# Patient Record
Sex: Male | Born: 1970 | Race: White | Hispanic: No | Marital: Single | Smoking: Never smoker
Health system: Southern US, Community
[De-identification: ages and names within clinical notes are randomized; demographics above are authoritative.]

## PROBLEM LIST (undated history)

## (undated) DIAGNOSIS — F639 Impulse disorder, unspecified: Secondary | ICD-10-CM

## (undated) DIAGNOSIS — G47419 Narcolepsy without cataplexy: Secondary | ICD-10-CM

## (undated) DIAGNOSIS — I10 Essential (primary) hypertension: Secondary | ICD-10-CM

## (undated) DIAGNOSIS — Q909 Down syndrome, unspecified: Secondary | ICD-10-CM

## (undated) DIAGNOSIS — G4730 Sleep apnea, unspecified: Secondary | ICD-10-CM

## (undated) DIAGNOSIS — F429 Obsessive-compulsive disorder, unspecified: Secondary | ICD-10-CM

## (undated) DIAGNOSIS — F72 Severe intellectual disabilities: Secondary | ICD-10-CM

## (undated) DIAGNOSIS — H5210 Myopia, unspecified eye: Secondary | ICD-10-CM

## (undated) DIAGNOSIS — H9190 Unspecified hearing loss, unspecified ear: Secondary | ICD-10-CM

## (undated) DIAGNOSIS — E119 Type 2 diabetes mellitus without complications: Secondary | ICD-10-CM

## (undated) DIAGNOSIS — H52209 Unspecified astigmatism, unspecified eye: Secondary | ICD-10-CM

## (undated) DIAGNOSIS — E079 Disorder of thyroid, unspecified: Secondary | ICD-10-CM

## (undated) DIAGNOSIS — Q819 Epidermolysis bullosa, unspecified: Secondary | ICD-10-CM

## (undated) DIAGNOSIS — E039 Hypothyroidism, unspecified: Secondary | ICD-10-CM

---

## 2001-09-22 ENCOUNTER — Ambulatory Visit (HOSPITAL_COMMUNITY): Admission: RE | Admit: 2001-09-22 | Discharge: 2001-09-22 | Payer: Self-pay

## 2006-01-28 ENCOUNTER — Ambulatory Visit (HOSPITAL_BASED_OUTPATIENT_CLINIC_OR_DEPARTMENT_OTHER): Admission: RE | Admit: 2006-01-28 | Discharge: 2006-01-28 | Payer: Self-pay | Admitting: Specialist

## 2006-02-07 ENCOUNTER — Ambulatory Visit: Payer: Self-pay | Admitting: Internal Medicine

## 2006-04-21 ENCOUNTER — Ambulatory Visit (HOSPITAL_BASED_OUTPATIENT_CLINIC_OR_DEPARTMENT_OTHER): Admission: RE | Admit: 2006-04-21 | Discharge: 2006-04-21 | Payer: Self-pay | Admitting: Specialist

## 2006-04-25 ENCOUNTER — Ambulatory Visit: Payer: Self-pay | Admitting: Internal Medicine

## 2007-10-31 ENCOUNTER — Emergency Department (HOSPITAL_BASED_OUTPATIENT_CLINIC_OR_DEPARTMENT_OTHER): Admission: EM | Admit: 2007-10-31 | Discharge: 2007-10-31 | Payer: Self-pay | Admitting: Emergency Medicine

## 2008-03-21 ENCOUNTER — Ambulatory Visit (HOSPITAL_BASED_OUTPATIENT_CLINIC_OR_DEPARTMENT_OTHER): Admission: RE | Admit: 2008-03-21 | Discharge: 2008-03-21 | Payer: Self-pay | Admitting: Specialist

## 2008-03-21 ENCOUNTER — Ambulatory Visit: Payer: Self-pay | Admitting: Diagnostic Radiology

## 2008-05-04 ENCOUNTER — Ambulatory Visit (HOSPITAL_BASED_OUTPATIENT_CLINIC_OR_DEPARTMENT_OTHER): Admission: RE | Admit: 2008-05-04 | Discharge: 2008-05-04 | Payer: Self-pay | Admitting: Specialist

## 2008-05-04 ENCOUNTER — Ambulatory Visit: Payer: Self-pay | Admitting: Diagnostic Radiology

## 2008-06-12 ENCOUNTER — Ambulatory Visit: Payer: Self-pay | Admitting: Pulmonary Disease

## 2008-06-12 DIAGNOSIS — G4733 Obstructive sleep apnea (adult) (pediatric): Secondary | ICD-10-CM

## 2008-06-12 DIAGNOSIS — Q909 Down syndrome, unspecified: Secondary | ICD-10-CM

## 2008-06-12 DIAGNOSIS — F518 Other sleep disorders not due to a substance or known physiological condition: Secondary | ICD-10-CM

## 2008-06-26 ENCOUNTER — Emergency Department (HOSPITAL_BASED_OUTPATIENT_CLINIC_OR_DEPARTMENT_OTHER): Admission: EM | Admit: 2008-06-26 | Discharge: 2008-06-26 | Payer: Self-pay | Admitting: Emergency Medicine

## 2009-06-24 ENCOUNTER — Emergency Department (HOSPITAL_BASED_OUTPATIENT_CLINIC_OR_DEPARTMENT_OTHER): Admission: EM | Admit: 2009-06-24 | Discharge: 2009-06-24 | Payer: Self-pay | Admitting: Emergency Medicine

## 2010-04-28 LAB — WOUND CULTURE

## 2010-06-27 NOTE — Procedures (Signed)
Frey, Justin              ACCOUNT NO.:  [id number]   MEDICAL RECORD NO.:  [id number]          PATIENT TYPE:  OUT   LOCATION:  SLEEP CENTER                 FACILITY:  [facility]   PHYSICIAN:  Clinton D. Maple Hudson, MD, FCCP, FACPDATE OF BIRTH:  1971/08/07   DATE OF STUDY:                            NOCTURNAL POLYSOMNOGRAM   INDICATION FOR STUDY:  Obstructive sleep apnea with hypersomnia.   EPWORTH SLEEPINESS SCORE:  8/24.  Weight is recorded as 117 pounds.  A  diagnostic NPSG on January 28, 2006 recorded an AHI of 16 per hour.  C-  PAP titration is requested.   MEDICATIONS:  Home medications are listed and reviewed.   SLEEP ARCHITECTURE:  Total sleep time 243 minutes, with sleep efficiency  of 53%.  Stage 1 was 11%, Stage 2 77%; Stages 3 and 4 absent.  REM 10%  of total sleep time.  Sleep latency 196 minutes.  REM latency 232  minutes.  Awake after sleep onset 25 minutes.  Arousal index 30.3;  indicating increased sleep fragmentation, especially during the latter  half of the night.   RESPIRATORY DATA:  C-PAP titration protocol.  C-PAP was titrated to 10  CWP, AHI 0 per hour.  A small Mirage Quattro full face mask was used,  with heated humidifier.   OXYGEN DATA:  Snoring was prevented by C-PAP and oxygen saturation held  at 96% on room air.   CARDIAC DATA:  Normal sinus rhythm.   MOVEMENT-PARASOMNIA:  Occasional limb jerk with little effect on sleep.   IMPRESSIONS-RECOMMENDATIONS:  1. Successful C-PAP titration to 10 CWP, AHI 0 per hour.  A small      Mirage Quattro full face mask was used, with a heated humidifier.  2. Baseline diagnostic NPSG on January 28, 2006 had recorded an AHI      of 16 per hour.      Clinton D. Maple Hudson, MD, [facility], FACP  Diplomate, Biomedical engineer of Sleep Medicine  Electronically Signed     CDY/MEDQ  D:  04/25/2006 14:58:08  T:  04/26/2006 10:12:27  Job:  [id number]

## 2010-06-27 NOTE — Procedures (Signed)
NAMETAMARI, BUSIC NO.:  [id number]   MEDICAL RECORD NO.:  [id number]          PATIENT TYPE:  OUT   LOCATION:  SLEEP CENTER                 FACILITY:  [facility]   PHYSICIAN:  Clinton D. Maple Hudson, MD, FCCP, FACPDATE OF BIRTH:  26-Sep-1971   DATE OF STUDY:  01/28/2006                            NOCTURNAL POLYSOMNOGRAM   INDICATION FOR STUDY:  Hypersomnia with sleep apnea.   EPWORTH SLEEPINESS SCORE:  8/24, BMI 27.5, weight 114 pounds.   MEDICATIONS:  Abilify, Levothroid, Lexapro, benztropine, Depakote.   A diagnostic NP ST protocol was requested.   SLEEP ARCHITECTURE:  Total sleep time 315 minutes with sleep efficiency  68%. Stage 1 was 5%, stage 2 86%, stages 3 and 4 8%, REM 1%  of total  sleep time. Sleep latency 122 minutes, REM latency 153 minutes, awake  after sleep onset 24 minutes. Arousal index 13. Medications were taken  before arrival at the sleep center.   RESPIRATORY DATA:  Apnea hypopnea index (AHI, RDI) 16 obstructive events  per hour indicating moderate obstructive sleep apnea/hypopnea syndrome  (normal range  0-5 per hour). There were 38 obstructive apnea's, one mixed apnea, and  45 hypopnea's. All asleep and therefore all events were recorded while  supine.   OXYGEN DATA:  Mild snoring with oxygen desaturation to a nadir of 85%.  Mean oxygen saturation through the study was 95% on room air.   CARDIAC DATA:  Normal sinus rhythm, heart rate 53-83 beats per minute.   MOVEMENT-PARASOMNIA:  Occasional limb jerk with little effect on sleep.   IMPRESSIONS-RECOMMENDATIONS:  1. Moderate obstructive sleep apnea/hypopnea syndrome, AHI 16 per hour      (normal range 0-5 per hour). All asleep and therefore all events      were while supine. REM AHI zero per hour.  2. Consider return for CPAP titration or evaluate for alternative      therapies as appropriate.     Clinton D. Maple Hudson, MD, [facility], FACP  Diplomate, Biomedical engineer of Sleep Medicine  Electronically Signed    CDY/MEDQ  D:  02/07/2006 14:39:13  T:  02/07/2006 22:31:40  Job:  [id number]

## 2012-04-09 ENCOUNTER — Emergency Department (HOSPITAL_COMMUNITY): Payer: Medicare Other

## 2012-04-09 ENCOUNTER — Encounter (HOSPITAL_COMMUNITY): Payer: Self-pay | Admitting: Emergency Medicine

## 2012-04-09 ENCOUNTER — Emergency Department (HOSPITAL_COMMUNITY)
Admission: EM | Admit: 2012-04-09 | Discharge: 2012-04-10 | Disposition: A | Discharge status: Other Acute Inpatient Hospital | Payer: Medicare Other | Attending: Emergency Medicine | Admitting: Emergency Medicine

## 2012-04-09 DIAGNOSIS — R109 Unspecified abdominal pain: Secondary | ICD-10-CM | POA: Insufficient documentation

## 2012-04-09 DIAGNOSIS — Z862 Personal history of diseases of the blood and blood-forming organs and certain disorders involving the immune mechanism: Secondary | ICD-10-CM | POA: Insufficient documentation

## 2012-04-09 DIAGNOSIS — E119 Type 2 diabetes mellitus without complications: Secondary | ICD-10-CM | POA: Insufficient documentation

## 2012-04-09 DIAGNOSIS — Z8659 Personal history of other mental and behavioral disorders: Secondary | ICD-10-CM | POA: Insufficient documentation

## 2012-04-09 DIAGNOSIS — Z8639 Personal history of other endocrine, nutritional and metabolic disease: Secondary | ICD-10-CM | POA: Insufficient documentation

## 2012-04-09 DIAGNOSIS — N390 Urinary tract infection, site not specified: Secondary | ICD-10-CM | POA: Insufficient documentation

## 2012-04-09 DIAGNOSIS — R319 Hematuria, unspecified: Secondary | ICD-10-CM | POA: Insufficient documentation

## 2012-04-09 DIAGNOSIS — Z8669 Personal history of other diseases of the nervous system and sense organs: Secondary | ICD-10-CM | POA: Insufficient documentation

## 2012-04-09 HISTORY — DX: Myopia, unspecified eye: H52.10

## 2012-04-09 HISTORY — DX: Severe intellectual disabilities: F72

## 2012-04-09 HISTORY — DX: Disorder of thyroid, unspecified: E07.9

## 2012-04-09 HISTORY — DX: Narcolepsy without cataplexy: G47.419

## 2012-04-09 HISTORY — DX: Unspecified hearing loss, unspecified ear: H91.90

## 2012-04-09 HISTORY — DX: Type 2 diabetes mellitus without complications: E11.9

## 2012-04-09 HISTORY — DX: Impulse disorder, unspecified: F63.9

## 2012-04-09 HISTORY — DX: Down syndrome, unspecified: Q90.9

## 2012-04-09 HISTORY — DX: Epidermolysis bullosa, unspecified: Q81.9

## 2012-04-09 HISTORY — DX: Obsessive-compulsive disorder, unspecified: F42.9

## 2012-04-09 LAB — URINE MICROSCOPIC-ADD ON

## 2012-04-09 LAB — URINALYSIS, ROUTINE W REFLEX MICROSCOPIC
Bilirubin Urine: NEGATIVE
Glucose, UA: NEGATIVE mg/dL
Ketones, ur: NEGATIVE mg/dL
Nitrite: NEGATIVE
Protein, ur: >300 mg/dL — AB
Specific Gravity, Urine: 1.022 (ref 1.005–1.030)
Urobilinogen, UA: 0.2 mg/dL (ref 0.0–1.0)
pH: 7 (ref 5.0–8.0)

## 2012-04-09 LAB — GLUCOSE, CAPILLARY: Glucose-Capillary: 140 mg/dL — ABNORMAL HIGH (ref 70–99)

## 2012-04-09 MED ORDER — CEPHALEXIN 500 MG PO CAPS: 500.0000 mg | ORAL_CAPSULE | Freq: Three times a day (TID) | ORAL | Status: DC

## 2012-04-09 MED ORDER — DEXTROSE 5 % IV SOLN: 1.0000 g | INTRAVENOUS | Status: DC

## 2012-04-09 NOTE — ED Notes (Signed)
[id number]<QM> Expected date:<BR> Expected time:<BR> Means of arrival:<BR> Comments:<BR> EMS/41 yo MR from group home-incontinent-blood in urine/abdominal pain

## 2012-04-09 NOTE — ED Notes (Signed)
Pt lives in a group home. Hx of Downs Syndrome. Pt began having frank blood in the urine and LUQ abdominal pain.

## 2012-04-09 NOTE — ED Provider Notes (Signed)
History     CSN: [id number]  Arrival date & time 04/09/12  2123   First MD Initiated Contact with Patient 04/09/12 2207      Chief Complaint  Patient presents with  . Hematuria  . Abdominal Pain    (Consider location/radiation/quality/duration/timing/severity/associated sxs/prior treatment) Patient is a 42 y.o. male presenting with hematuria and abdominal pain. The history is provided by the patient. The history is limited by the condition of the patient.  Hematuria Associated symptoms include abdominal pain.  Abdominal Pain Associated symptoms: hematuria   pt with hx Downs Syndrome, from group home where he was noted to have blood in urine and c/o left flank pain. Pain better now. Level 5 caveat - very poor/difficult historian due to MR. No fevers.     Past Medical History  Diagnosis Date  . Impulse control disorder   . MR (mental retardation), severe   . Down's syndrome   . Diabetes mellitus without complication   . Thyroid disease     hypothyroid  . OCD (obsessive compulsive disorder)   . Keratolysis bullosa hereditaria   . Narcolepsy   . Hearing loss   . Myopia     History reviewed. No pertinent past surgical history.  History reviewed. No pertinent family history.  History  Substance Use Topics  . Smoking status: Never Smoker   . Smokeless tobacco: Never Used  . Alcohol Use: No      Review of Systems  Unable to perform ROS: Other  Gastrointestinal: Positive for abdominal pain.  Genitourinary: Positive for hematuria.  level 5 caveat  -  MR, Downs Syndrome.   Allergies  Review of patient's allergies indicates no known allergies.  Home Medications  No current outpatient prescriptions on file.  BP 109/63  Pulse 74  Temp(Src) 97.4 F (36.3 C) (Oral)  Resp 20  SpO2 98%  Physical Exam  Nursing note and vitals reviewed. Constitutional: He appears well-developed and well-nourished. No distress.  HENT:  Mouth/Throat: Oropharynx is clear and moist.   Eyes: Conjunctivae are normal. No scleral icterus.  Neck: Neck supple. No tracheal deviation present.  Cardiovascular: Normal rate, regular rhythm, normal heart sounds and intact distal pulses.   Pulmonary/Chest: Effort normal and breath sounds normal. No accessory muscle usage. No respiratory distress.  Abdominal: Soft. Bowel sounds are normal. He exhibits no distension and no mass. There is no tenderness. There is no rebound and no guarding.  Genitourinary:  No cva tenderness. Normal ext genitalia. No scrotal or testicular swelling or tenderness  Musculoskeletal: Normal range of motion. He exhibits no edema and no tenderness.  Neurological: He is alert.  Skin: Skin is warm and dry. No rash noted.  Psychiatric: He has a normal mood and affect.    ED Course  Procedures (including critical care time)  Results for orders placed during the hospital encounter of 04/09/12  GLUCOSE, CAPILLARY      Result Value Range   Glucose-Capillary 140 (*) 70 - 99 mg/dL  URINALYSIS, ROUTINE W REFLEX MICROSCOPIC      Result Value Range   Color, Urine RED (*) YELLOW   APPearance CLOUDY (*) CLEAR   Specific Gravity, Urine 1.022  1.005 - 1.030   pH 7.0  5.0 - 8.0   Glucose, UA NEGATIVE  NEGATIVE mg/dL   Hgb urine dipstick LARGE (*) NEGATIVE   Bilirubin Urine NEGATIVE  NEGATIVE   Ketones, ur NEGATIVE  NEGATIVE mg/dL   Protein, ur >409 (*) NEGATIVE mg/dL   Urobilinogen, UA 0.2  0.0 - 1.0 mg/dL   Nitrite NEGATIVE  NEGATIVE   Leukocytes, UA SMALL (*) NEGATIVE  URINE MICROSCOPIC-ADD ON      Result Value Range   Squamous Epithelial / LPF RARE  RARE   WBC, UA 0-2  <3 WBC/hpf   RBC / HPF TOO NUMEROUS TO COUNT  <3 RBC/hpf   Bacteria, UA RARE  RARE   Urine-Other URINALYSIS PERFORMED ON SUPERNATANT     Ct Abdomen Pelvis Wo Contrast  04/09/2012  *RADIOLOGY REPORT*  Clinical Data: Lower anterior pelvic pain  CT ABDOMEN AND PELVIS WITHOUT CONTRAST  Technique:  Multidetector CT imaging of the abdomen and  pelvis was performed following the standard protocol without intravenous contrast.  Comparison: None.  Findings: Mild bibasilar opacities, favor atelectasis.  Heart size within normal limits.  Organ abnormality/lesion detection is limited in the absence of intravenous contrast. Within this limitation, severe hepatic steatosis.  Unremarkable biliary system, spleen, pancreas, adrenal glands, kidneys.  No hydronephrosis or hydroureter  No CT evidence for colitis.  Mild colonic diverticulosis.  Normal appendix.  Small bowel loops are normal course and caliber.  No free intraperitoneal air or fluid. No lymphadenopathy.  Normal caliber aorta and branch vessels.  Circumferential bladder wall thickening with irregular intraluminal contour.  Small amount of perivesicular fat stranding / fluid.  No bladder wall emphysema.  No acute osseous finding.  Sequelae of prior posterior right rib fractures.  IMPRESSION: Marked circumferential bladder wall thickening with mild perivesicular fat stranding / fluid. Most in keeping with a nonspecific cystitis.  Recommend urinalysis and cystoscopy correlation.  Severe hepatic steatosis.   Original Report Authenticated By: Jearld Lesch, M.D.        MDM  Labs. Ct.  Reviewed nursing notes and prior charts for additional history.   Ct reviewed, no ureteral stone, ?cystitis. Will cx urine. Rocephin iv.   rx keflex for home.   abd soft nt. No nv. Pt appears stable for d/c.         Suzi Roots, MD 04/09/12 2329

## 2012-04-10 MED ORDER — CEFTRIAXONE SODIUM 1 G IJ SOLR
1.0000 g | Freq: Once | INTRAMUSCULAR | Status: AC
  Administered 2012-04-10: 1 g via INTRAMUSCULAR
  Filled 2012-04-10: qty 10

## 2012-04-10 NOTE — ED Notes (Signed)
Report called to group home Rn, Rosey Bath.

## 2012-04-11 LAB — URINE CULTURE

## 2012-04-23 ENCOUNTER — Encounter (HOSPITAL_COMMUNITY): Payer: Self-pay | Admitting: Nurse Practitioner

## 2012-04-23 ENCOUNTER — Emergency Department (HOSPITAL_COMMUNITY)
Admission: EM | Admit: 2012-04-23 | Discharge: 2012-04-23 | Disposition: A | Discharge status: Home / Self Care | Payer: Medicare Other | Attending: Emergency Medicine | Admitting: Emergency Medicine

## 2012-04-23 DIAGNOSIS — Z8659 Personal history of other mental and behavioral disorders: Secondary | ICD-10-CM | POA: Insufficient documentation

## 2012-04-23 DIAGNOSIS — Z8669 Personal history of other diseases of the nervous system and sense organs: Secondary | ICD-10-CM | POA: Insufficient documentation

## 2012-04-23 DIAGNOSIS — N390 Urinary tract infection, site not specified: Secondary | ICD-10-CM | POA: Insufficient documentation

## 2012-04-23 DIAGNOSIS — R300 Dysuria: Secondary | ICD-10-CM

## 2012-04-23 DIAGNOSIS — R109 Unspecified abdominal pain: Secondary | ICD-10-CM | POA: Insufficient documentation

## 2012-04-23 DIAGNOSIS — H9190 Unspecified hearing loss, unspecified ear: Secondary | ICD-10-CM | POA: Insufficient documentation

## 2012-04-23 DIAGNOSIS — R350 Frequency of micturition: Secondary | ICD-10-CM | POA: Insufficient documentation

## 2012-04-23 DIAGNOSIS — F605 Obsessive-compulsive personality disorder: Secondary | ICD-10-CM | POA: Insufficient documentation

## 2012-04-23 DIAGNOSIS — Z7982 Long term (current) use of aspirin: Secondary | ICD-10-CM | POA: Insufficient documentation

## 2012-04-23 DIAGNOSIS — Z87798 Personal history of other (corrected) congenital malformations: Secondary | ICD-10-CM | POA: Insufficient documentation

## 2012-04-23 DIAGNOSIS — Z79899 Other long term (current) drug therapy: Secondary | ICD-10-CM | POA: Insufficient documentation

## 2012-04-23 DIAGNOSIS — E039 Hypothyroidism, unspecified: Secondary | ICD-10-CM | POA: Insufficient documentation

## 2012-04-23 DIAGNOSIS — E119 Type 2 diabetes mellitus without complications: Secondary | ICD-10-CM | POA: Insufficient documentation

## 2012-04-23 LAB — URINALYSIS, ROUTINE W REFLEX MICROSCOPIC
Bilirubin Urine: NEGATIVE
Glucose, UA: NEGATIVE mg/dL
Hgb urine dipstick: NEGATIVE
Specific Gravity, Urine: 1.014 (ref 1.005–1.030)
pH: 6.5 (ref 5.0–8.0)

## 2012-04-23 NOTE — ED Notes (Signed)
Urine sent

## 2012-04-23 NOTE — ED Notes (Signed)
Pt seen at [facility] for [facility] on 3/[facility], started on oral abx but symptoms persist. Staff that care for pt reports hematuria has resolved but continues to have urinary frequency and L side pain

## 2012-04-23 NOTE — ED Provider Notes (Signed)
History     CSN: [id number]  Arrival date & time 04/23/12  1322   First MD Initiated Contact with Patient 04/23/12 1421      Chief Complaint  Patient presents with  . Urinary Tract Infection    (Consider location/radiation/quality/duration/timing/severity/associated sxs/prior treatment) HPI Comments: Patient with a history of Down's syndrome presents for urinary frequency, left lower quadrant and flank pain x 2 weeks. Patient was seen on March 1 for chief complaint along with hematuria. Patient was worked up with labs, urinalysis, and CT abdomen pelvis without contrast; findings suggestive of cystitis. Patient treated in the ED with IV Rocephin and discharged with Keflex. Nurses state that patient completed his course of antibiotics and his hematuria has resolved, but his urinary frequency and complaints of left lower quadrant and flank pain remain the same. Group home denies fevers and changes in eating habits; state patient has had normal bowel movements, no nausea, and no emesis. Patient is a poor historian secondary to his mental retardation.  Patient is a 42 y.o. male presenting with urinary tract infection.  Urinary Tract Infection Associated symptoms include abdominal pain. Pertinent negatives include no fever.    Past Medical History  Diagnosis Date  . Impulse control disorder   . MR (mental retardation), severe   . Down's syndrome   . Diabetes mellitus without complication   . Thyroid disease     hypothyroid  . OCD (obsessive compulsive disorder)   . Keratolysis bullosa hereditaria   . Narcolepsy   . Hearing loss   . Myopia     History reviewed. No pertinent past surgical history.  History reviewed. No pertinent family history.  History  Substance Use Topics  . Smoking status: Never Smoker   . Smokeless tobacco: Never Used  . Alcohol Use: No    Review of Systems  Unable to perform ROS: Other  Constitutional: Negative for fever.  Gastrointestinal: Positive  for abdominal pain.  Genitourinary: Positive for frequency and flank pain. Negative for hematuria.    Allergies  Review of patient's allergies indicates no known allergies.  Home Medications   Current Outpatient Rx  Name  Route  Sig  Dispense  Refill  . aspirin 81 MG chewable tablet   Oral   Chew 81 mg by mouth daily.         . enalapril (VASOTEC) 2.5 MG tablet   Oral   Take 2.5 mg by mouth daily.         Marland Kitchen escitalopram (LEXAPRO) 10 MG tablet   Oral   Take 10 mg by mouth daily.         Marland Kitchen levothyroxine (SYNTHROID, LEVOTHROID) 75 MCG tablet   Oral   Take 75 mcg by mouth daily.         . metFORMIN (GLUCOPHAGE) 500 MG tablet   Oral   Take 500 mg by mouth 2 (two) times daily with a meal.         . oxybutynin (DITROPAN) 5 MG tablet   Oral   Take 10 mg by mouth 2 (two) times daily.         . propranolol ER (INDERAL LA) 160 MG SR capsule   Oral   Take 160 mg by mouth daily.         . risperiDONE (RISPERDAL) 0.5 MG tablet   Oral   Take 0.5 mg by mouth daily.         . temazepam (RESTORIL) 30 MG capsule   Oral   Take  30 mg by mouth at bedtime.         . topiramate (TOPAMAX) 100 MG tablet   Oral   Take 100 mg by mouth 3 (three) times daily.         . Vitamin D, Ergocalciferol, (DRISDOL) 50000 UNITS CAPS   Oral   Take 50,000 Units by mouth every 30 (thirty) days. Last day of every month           BP 141/105  Pulse 66  Temp(Src) 98.4 F (36.9 C) (Oral)  Resp 16  SpO2 96%  Physical Exam  Nursing note and vitals reviewed. Constitutional: He is oriented to person, place, and time. No distress.  Patient is resting comfortably in the bed in no acute distress.  HENT:  Right Ear: External ear normal.  Left Ear: External ear normal.  Mouth/Throat: Oropharynx is clear and moist. No oropharyngeal exudate.  Eyes: Conjunctivae are normal. Pupils are equal, round, and reactive to light. Right eye exhibits no discharge. Left eye exhibits no  discharge. No scleral icterus.  Neck: Normal range of motion. Neck supple.  Cardiovascular: Normal rate, regular rhythm and intact distal pulses.   Pulmonary/Chest: Effort normal and breath sounds normal. No respiratory distress. He has no wheezes. He has no rales.  Abdominal: Soft. Bowel sounds are normal. He exhibits distension. He exhibits no mass. There is no tenderness. There is no rebound and no guarding.  Genitourinary: Testes normal and penis normal. Right testis shows no mass and no swelling. Left testis shows no mass and no swelling. No penile erythema. No discharge found.  Musculoskeletal: Normal range of motion. He exhibits no edema.  Lymphadenopathy:    He has no cervical adenopathy.  Neurological: He is alert and oriented to person, place, and time.  Skin: Skin is warm and dry. No rash noted. He is not diaphoretic. No erythema.  Psychiatric: He has a normal mood and affect. His behavior is normal.    ED Course  Procedures (including critical care time)  Labs Reviewed  URINALYSIS, ROUTINE W REFLEX MICROSCOPIC   No results found.   1. Dysuria     MDM  Patient is a 42 year old male with mental retardation history who presents for complaints of left lower quadrant, left flank pain, and urinary frequency. Patient seen 2 weeks ago for same complaint as well as hematuria; was given kidney stone workup with findings consistent with cystitis. Patient treated in ED with IV Rocephin and as an outpatient with Keflex. Hematuria has resolved and urine from 04/09/2012 with no growth 48 hours after culture. Urinalysis ordered today as well as a bladder scan to assess pre-and post void residual. Patient otherwise well appearing with no findings elicited on physical exam; afebrile and VSS.  Patient's urinalysis normal without findings of urinary tract infection; nitrates and leukocytes negative. Patient signed out the Dr. Kathie Rhodes. Rancour at end of shift for dispo.     Antony Madura,  PA-C 04/23/12 2104

## 2012-04-24 NOTE — ED Provider Notes (Signed)
Medical screening examination/treatment/procedure(s) were conducted as a shared visit with non-physician practitioner(s) and myself.  I personally evaluated the patient during the encounter  Continued dysuria and flank pain after treatment for UTI 2 weeks ago. Urine culture negative.   Glynn Octave, MD 04/24/12 [id number]

## 2012-05-26 ENCOUNTER — Encounter (HOSPITAL_COMMUNITY): Payer: Self-pay | Admitting: Emergency Medicine

## 2012-05-26 ENCOUNTER — Emergency Department (HOSPITAL_COMMUNITY)
Admission: EM | Admit: 2012-05-26 | Discharge: 2012-05-26 | Disposition: A | Discharge status: Home / Self Care | Payer: Medicare Other | Attending: Emergency Medicine | Admitting: Emergency Medicine

## 2012-05-26 DIAGNOSIS — Z7982 Long term (current) use of aspirin: Secondary | ICD-10-CM | POA: Insufficient documentation

## 2012-05-26 DIAGNOSIS — Z8659 Personal history of other mental and behavioral disorders: Secondary | ICD-10-CM | POA: Insufficient documentation

## 2012-05-26 DIAGNOSIS — E039 Hypothyroidism, unspecified: Secondary | ICD-10-CM | POA: Insufficient documentation

## 2012-05-26 DIAGNOSIS — E119 Type 2 diabetes mellitus without complications: Secondary | ICD-10-CM | POA: Insufficient documentation

## 2012-05-26 DIAGNOSIS — N390 Urinary tract infection, site not specified: Secondary | ICD-10-CM | POA: Insufficient documentation

## 2012-05-26 DIAGNOSIS — Z79899 Other long term (current) drug therapy: Secondary | ICD-10-CM | POA: Insufficient documentation

## 2012-05-26 DIAGNOSIS — F429 Obsessive-compulsive disorder, unspecified: Secondary | ICD-10-CM | POA: Insufficient documentation

## 2012-05-26 DIAGNOSIS — Z8669 Personal history of other diseases of the nervous system and sense organs: Secondary | ICD-10-CM | POA: Insufficient documentation

## 2012-05-26 DIAGNOSIS — Q909 Down syndrome, unspecified: Secondary | ICD-10-CM | POA: Insufficient documentation

## 2012-05-26 DIAGNOSIS — H9190 Unspecified hearing loss, unspecified ear: Secondary | ICD-10-CM | POA: Insufficient documentation

## 2012-05-26 LAB — URINALYSIS, ROUTINE W REFLEX MICROSCOPIC
Glucose, UA: 100 mg/dL — AB
Specific Gravity, Urine: 1.023 (ref 1.005–1.030)
Urobilinogen, UA: 1 mg/dL (ref 0.0–1.0)
pH: 6.5 (ref 5.0–8.0)

## 2012-05-26 LAB — URINE MICROSCOPIC-ADD ON

## 2012-05-26 LAB — GLUCOSE, CAPILLARY: Glucose-Capillary: 163 mg/dL — ABNORMAL HIGH (ref 70–99)

## 2012-05-26 LAB — POCT I-STAT, CHEM 8
Glucose, Bld: 148 mg/dL — ABNORMAL HIGH (ref 70–99)
HCT: 44 % (ref 39.0–52.0)
Hemoglobin: 15 g/dL (ref 13.0–17.0)
Potassium: 4.1 mEq/L (ref 3.5–5.1)

## 2012-05-26 MED ORDER — CEPHALEXIN 500 MG PO CAPS: 500.0000 mg | ORAL_CAPSULE | Freq: Four times a day (QID) | ORAL | Status: DC

## 2012-05-26 MED ORDER — CEPHALEXIN 250 MG PO CAPS
500.0000 mg | ORAL_CAPSULE | Freq: Once | ORAL | Status: AC
  Administered 2012-05-26: 500 mg via ORAL
  Filled 2012-05-26: qty 1
  Filled 2012-05-26 (×2): qty 2

## 2012-05-26 NOTE — ED Provider Notes (Signed)
History     CSN: [id number]  Arrival date & time 05/26/12  1739   First MD Initiated Contact with Patient 05/26/12 1940      Chief Complaint  Patient presents with  . Hematuria    Patient is a 42 y.o. male presenting with hematuria. The history is provided by the patient and a caregiver. The history is limited by a developmental delay.  Hematuria This is a recurrent problem. Episode onset: earlier today. The problem occurs hourly. The problem has been gradually worsening. Pertinent negatives include no chest pain and no abdominal pain. Nothing aggravates the symptoms. Nothing relieves the symptoms.  pt lives in group home He presents with his nurse bethany who knows patient well He has had several episodes of hematuria today No fever/vomiting.  He has reported back pain No trauma reported There is no new weakness reported He is not on anticoagulants Per nursing he has had this previously.  He has urology followup next week  Past Medical History  Diagnosis Date  . Impulse control disorder   . MR (mental retardation), severe   . Down's syndrome   . Diabetes mellitus without complication   . Thyroid disease     hypothyroid  . OCD (obsessive compulsive disorder)   . Keratolysis bullosa hereditaria   . Narcolepsy   . Hearing loss   . Myopia     History reviewed. No pertinent past surgical history.  History reviewed. No pertinent family history.  History  Substance Use Topics  . Smoking status: Never Smoker   . Smokeless tobacco: Never Used  . Alcohol Use: No      Review of Systems  Unable to perform ROS: Patient nonverbal  Cardiovascular: Negative for chest pain.  Gastrointestinal: Negative for abdominal pain.  Genitourinary: Positive for hematuria.    Allergies  Review of patient's allergies indicates no known allergies.  Home Medications   Current Outpatient Rx  Name  Route  Sig  Dispense  Refill  . aspirin 81 MG chewable tablet   Oral   Chew 81 mg by  mouth daily.         . enalapril (VASOTEC) 2.5 MG tablet   Oral   Take 2.5 mg by mouth daily. Hold if BP <90/60         . escitalopram (LEXAPRO) 10 MG tablet   Oral   Take 10 mg by mouth daily.         Marland Kitchen levothyroxine (SYNTHROID, LEVOTHROID) 75 MCG tablet   Oral   Take 75 mcg by mouth daily.         . metFORMIN (GLUCOPHAGE) 500 MG tablet   Oral   Take 500 mg by mouth 2 (two) times daily with a meal.         . oxybutynin (DITROPAN) 5 MG tablet   Oral   Take 10 mg by mouth 2 (two) times daily.         . propranolol (INDERAL) 60 MG tablet   Oral   Take 60 mg by mouth at bedtime. Hold if pulse <60 or BP <90/60         . propranolol ER (INDERAL LA) 160 MG SR capsule   Oral   Take 160 mg by mouth daily.         . risperiDONE (RISPERDAL) 0.5 MG tablet   Oral   Take 0.5 mg by mouth daily.         . risperiDONE (RISPERDAL) 1 MG tablet   Oral  Take 1 mg by mouth at bedtime.         . temazepam (RESTORIL) 30 MG capsule   Oral   Take 30 mg by mouth at bedtime.         . topiramate (TOPAMAX) 100 MG tablet   Oral   Take 100 mg by mouth 3 (three) times daily.         . Vitamin D, Ergocalciferol, (DRISDOL) 50000 UNITS CAPS   Oral   Take 50,000 Units by mouth every 30 (thirty) days. Last day of every month           BP 104/73  Pulse 73  Temp(Src) 98.1 F (36.7 C) (Oral)  SpO2 98%  Physical Exam CONSTITUTIONAL: pt is in no distress.  He appears to have Downs syndrome HEAD: Normocephalic/atraumatic EYES: EOMI ENMT: Mucous membranes moist NECK: supple no meningeal signs CV: no murmurs/rubs/gallops noted LUNGS: Lungs are clear to auscultation bilaterally, no apparent distress ABDOMEN: soft, nontender, no rebound or guarding GU:no cva tenderness. No scrotal tenderness/erythema/bruising/swelling noted He is circumcised.  No blood noted at meatus. No suprapubic tenderness NEURO: Pt is awake/alert, moves all extremitiesx4, no distress is  noted EXTREMITIES: pulses normal, full ROM SKIN: warm, color normal   ED Course  Procedures  Labs Reviewed  URINALYSIS, ROUTINE W REFLEX MICROSCOPIC - Abnormal; Notable for the following:    Color, Urine RED (*)    APPearance TURBID (*)    Glucose, UA 100 (*)    Hgb urine dipstick LARGE (*)    Bilirubin Urine LARGE (*)    Ketones, ur 40 (*)    Protein, ur >300 (*)    Nitrite POSITIVE (*)    Leukocytes, UA MODERATE (*)    All other components within normal limits  URINE MICROSCOPIC-ADD ON - Abnormal; Notable for the following:    Bacteria, UA FEW (*)    All other components within normal limits  GLUCOSE, CAPILLARY - Abnormal; Notable for the following:    Glucose-Capillary 163 (*)    All other components within normal limits  URINE CULTURE   8:54 PM Pt with h/o MR and Downs syndrome here for hematuria He is in no distress but his history is limited Will perform bladder scan to ensure no significant urinary retention as may have some clots.  Will also check chem 8 panel to evaluate for anemia or renal dysfunction He has had CT imaging recently in our system do not feel this needs to be repeated Will start on antibiotics and send urine culture 9:36 PM No significant urinary retention Renal function appropriate and no anemia Stable for d/c.  He has followup with urology already arranged   MDM  Nursing notes including past medical history and social history reviewed and considered in documentation Labs/vital reviewed and considered Previous records reviewed and considered         Joya Gaskins, MD 05/26/12 2138

## 2012-05-26 NOTE — ED Notes (Signed)
Pt here with group home rep c/o hematuria today; pt sts pain when he goes to bathroom; pt with hx of MR and down syndrome

## 2012-05-26 NOTE — ED Notes (Signed)
The pt lives in a group home and is here with a caregiver.  Bright red blood with urination today and frequent urination.  History of the same.  Pt alert  Caregiver answering all questions asked

## 2012-05-26 NOTE — ED Notes (Addendum)
Bladder scan amount 120

## 2012-05-28 LAB — URINE CULTURE: Colony Count: NO GROWTH

## 2012-06-23 DIAGNOSIS — K7689 Other specified diseases of liver: Secondary | ICD-10-CM | POA: Insufficient documentation

## 2013-10-19 ENCOUNTER — Encounter (HOSPITAL_COMMUNITY): Payer: Self-pay | Admitting: Pharmacy Technician

## 2013-10-20 ENCOUNTER — Encounter (HOSPITAL_COMMUNITY)
Admission: RE | Admit: 2013-10-20 | Discharge: 2013-10-20 | Disposition: A | Discharge status: Home / Self Care | Payer: Medicare Other | Source: Ambulatory Visit | Attending: Anesthesiology | Admitting: Anesthesiology

## 2013-10-20 ENCOUNTER — Encounter (HOSPITAL_COMMUNITY): Payer: Self-pay

## 2013-10-20 ENCOUNTER — Encounter (HOSPITAL_COMMUNITY)
Admission: RE | Admit: 2013-10-20 | Discharge: 2013-10-20 | Disposition: A | Discharge status: Home / Self Care | Payer: Medicare Other | Source: Ambulatory Visit | Attending: Dentistry | Admitting: Dentistry

## 2013-10-20 DIAGNOSIS — K029 Dental caries, unspecified: Secondary | ICD-10-CM | POA: Insufficient documentation

## 2013-10-20 DIAGNOSIS — Z01812 Encounter for preprocedural laboratory examination: Secondary | ICD-10-CM | POA: Diagnosis present

## 2013-10-20 DIAGNOSIS — Z01810 Encounter for preprocedural cardiovascular examination: Secondary | ICD-10-CM | POA: Insufficient documentation

## 2013-10-20 HISTORY — DX: Sleep apnea, unspecified: G47.30

## 2013-10-20 HISTORY — DX: Hypothyroidism, unspecified: E03.9

## 2013-10-20 HISTORY — DX: Essential (primary) hypertension: I10

## 2013-10-20 LAB — CBC
HCT: 41.6 % (ref 39.0–52.0)
HEMOGLOBIN: 14.4 g/dL (ref 13.0–17.0)
MCH: 32.5 pg (ref 26.0–34.0)
MCHC: 34.6 g/dL (ref 30.0–36.0)
MCV: 93.9 fL (ref 78.0–100.0)
Platelets: 124 10*3/uL — ABNORMAL LOW (ref 150–400)
RBC: 4.43 MIL/uL (ref 4.22–5.81)
RDW: 13.9 % (ref 11.5–15.5)
WBC: 6.4 10*3/uL (ref 4.0–10.5)

## 2013-10-20 LAB — BASIC METABOLIC PANEL
Anion gap: 13 (ref 5–15)
BUN: 14 mg/dL (ref 6–23)
CHLORIDE: 100 meq/L (ref 96–112)
CO2: 23 mEq/L (ref 19–32)
CREATININE: 0.78 mg/dL (ref 0.50–1.35)
Calcium: 8.9 mg/dL (ref 8.4–10.5)
Glucose, Bld: 281 mg/dL — ABNORMAL HIGH (ref 70–99)
Potassium: 4.5 mEq/L (ref 3.7–5.3)
Sodium: 136 mEq/L — ABNORMAL LOW (ref 137–147)

## 2013-10-20 NOTE — Progress Notes (Addendum)
Primary - [facility] Does not have cardiologist No recent cardiac testing that caregivers can remember. Mother was not present at preadmission.  Caregivers stated they believe consent was signed at dr. Cathlean [place] office by mother

## 2013-10-20 NOTE — Pre-Procedure Instructions (Signed)
Justin Frey  10/20/2013   Your procedure is scheduled on:  Thursday, September 17th  Report to [facility] Admitting at 0830 AM.  Call this number if you have problems the morning of surgery: [phone]   Remember:   Do not eat food or drink liquids after midnight.   Take these medicines the morning of surgery with A SIP OF WATER: lexapro, synthroid, propranolol, risperdal, topamax   Do not wear jewelry.  Do not wear lotions, powders, or perfumes. You may wear deodorant.  Men may shave face and neck.  Do not bring valuables to the hospital.  [facility] is not responsible for any belongings or valuables.               Contacts, dentures or bridgework may not be worn into surgery.  Leave suitcase in the car. After surgery it may be brought to your room.  For patients admitted to the hospital, discharge time is determined by your  treatment team.               Patients discharged the day of surgery will not be allowed to drive home.  Please read over the following fact sheets that you were given: Pain Booklet, Coughing and Deep Breathing and Surgical Site Infection Prevention [place] - Preparing for Surgery  Before surgery, you can play an important role.  Because skin is not sterile, your skin needs to be as free of germs as possible.  You can reduce the number of germs on you skin by washing with CHG (chlorahexidine gluconate) soap before surgery.  CHG is an antiseptic cleaner which kills germs and bonds with the skin to continue killing germs even after washing.  Please DO NOT use if you have an allergy to CHG or antibacterial soaps.  If your skin becomes reddened/irritated stop using the CHG and inform your nurse when you arrive at Short Stay.  Do not shave (including legs and underarms) for at least 48 hours prior to the first CHG shower.  You may shave your face.  Please follow these instructions carefully:   1.  Shower with CHG Soap the night before surgery  and the morning of Surgery.  2.  If you choose to wash your hair, wash your hair first as usual with your normal shampoo.  3.  After you shampoo, rinse your hair and body thoroughly to remove the shampoo.  4.  Use CHG as you would any other liquid soap.  You can apply CHG directly to the skin and wash gently with scrungie or a clean washcloth.  5.  Apply the CHG Soap to your body ONLY FROM THE NECK DOWN.  Do not use on open wounds or open sores.  Avoid contact with your eyes, ears, mouth and genitals (private parts).  Wash genitals (private parts) with your normal soap.  6.  Wash thoroughly, paying special attention to the area where your surgery will be performed.  7.  Thoroughly rinse your body with warm water from the neck down.  8.  DO NOT shower/wash with your normal soap after using and rinsing off the CHG Soap.  9.  Pat yourself dry with a clean towel.            10.  Wear clean pajamas.            11.  Place clean sheets on your bed the night of your first shower and do not sleep with pets.  Day of  Surgery  Do not apply any lotions/deoderants the morning of surgery.  Please wear clean clothes to the hospital/surgery center.

## 2013-10-23 NOTE — Progress Notes (Addendum)
Anesthesia Chart Review:  Patient is a 43 year old male posted for dental restoration/extraction with xray and cleanings on 10/1713 by Dr. Martie Round.    History includes non-smoker, Down's Syndrome, impulse control disorder, severe mental retardation, OCD, narcolepsy, DM2 (reported fasting 150-200), HTN, hypothyroidism, myopia, OSA without CPAP use, hearing loss, keratolysis bullosa hereditaria, hepatic steatosis by 2014 CT.  BMI is consistent with morbid obesity.  PCP is Dr. Midge Aver.  Mom did not come with patient to his PAT visit.  I was not asked to evaluate him during his PAT appointment.   Meds: Citucel, ASA 81 mg, Calcium with D, Votaren, Vasotec, Lexapro, Synthroid, metformin, Relafen, Inderal LA, risperidone, Zocor, Restoril, Topamax.    EKG on 10/20/13 showed: NSR, minimal voltage criteria for LVH, possible anterior infarct (age undetermined). Currently, no comparison EKG is available.  CXR on 10/20/13 showed: No active cardiopulmonary disease.  Preoperative labs noted.  Glucose is 281.  He will get a fasting CBG on arrival.  PLT are low at 124K.  No HFP was done at PAT because no liver disease history reported, although he had severe hepatic steatosis on abdominal CT on 04/09/12.    I left a message with Boyd Kerbs at Dr. Janyce Llanos office regarding need for records, H&P. Will follow-up once available.    Velna Ochs [facility] Short Stay Center/Anesthesiology Phone [phone] 10/23/2013 2:59 PM  Addendum:  09/28/13 H&P from Regis Bill, FNP-C at [facility] received. I called and spoke with nurse Rosey Bath at [facility], who confirmed patient was without any known cardiac issues.  She was not aware of any prior c-spine films.  I finally heard back from patient's mother Dasean Brow [phone]) this afternoon.  She also confirms that her son does not have any known cardiac issues or congenital heart defects.  No on-going specialists.  She denies prior c-spine films or known cardiac testing  (other than his heart "checking out okay" at birth). She denies that he or their family has had any known prior anesthesia complications.  She did inquire whether or not he would need to stay overnight.  He has OSA without cpap use, so I told her that depending on how he did in recovery he may need to stay overnight due to his untreated OSA.  A final decision will be made on the day of his procedure.  She will try to be here on the day of surgery, but her husband has advanced Parkinson's disease and it may depend on how he is doing that day.    Prior to my conversation with patient's mother, I reviewed his chart with anesthesiologist Dr. Katrinka Blazing.  If no prior c-spine films, then he will need c-spine flexion-extension xray on the day of surgery.  He will also need HFP due to mild thrombocytopenia with history of hepatic steatosis.    Velna Ochs [facility] Short Stay Center/Anesthesiology Phone [phone] 10/24/2013 5:43 PM

## 2013-10-24 ENCOUNTER — Encounter (HOSPITAL_COMMUNITY): Payer: Self-pay

## 2013-10-25 ENCOUNTER — Other Ambulatory Visit: Payer: Self-pay | Admitting: Dentistry

## 2013-10-26 ENCOUNTER — Ambulatory Visit (HOSPITAL_COMMUNITY)
Admission: RE | Admit: 2013-10-26 | Discharge: 2013-10-26 | Disposition: A | Discharge status: Home / Self Care | Payer: Medicare Other | Source: Ambulatory Visit | Attending: Dentistry | Admitting: Dentistry

## 2013-10-26 ENCOUNTER — Encounter (HOSPITAL_COMMUNITY): Payer: Medicare Other | Admitting: Vascular Surgery

## 2013-10-26 ENCOUNTER — Encounter (HOSPITAL_COMMUNITY): Payer: Self-pay | Admitting: *Deleted

## 2013-10-26 ENCOUNTER — Encounter (HOSPITAL_COMMUNITY)
Admission: RE | Disposition: A | Discharge status: Home / Self Care | Payer: Self-pay | Source: Ambulatory Visit | Attending: Dentistry

## 2013-10-26 ENCOUNTER — Ambulatory Visit (HOSPITAL_COMMUNITY): Payer: Medicare Other | Admitting: Anesthesiology

## 2013-10-26 ENCOUNTER — Ambulatory Visit (HOSPITAL_COMMUNITY): Payer: Medicare Other

## 2013-10-26 DIAGNOSIS — F79 Unspecified intellectual disabilities: Secondary | ICD-10-CM | POA: Insufficient documentation

## 2013-10-26 DIAGNOSIS — K029 Dental caries, unspecified: Secondary | ICD-10-CM | POA: Insufficient documentation

## 2013-10-26 HISTORY — PX: DENTAL RESTORATION/EXTRACTION WITH X-RAY: SHX5796

## 2013-10-26 LAB — HEPATIC FUNCTION PANEL
ALT: 39 U/L (ref 0–53)
AST: 36 U/L (ref 0–37)
Albumin: 3.2 g/dL — ABNORMAL LOW (ref 3.5–5.2)
Alkaline Phosphatase: 59 U/L (ref 39–117)
BILIRUBIN TOTAL: 0.4 mg/dL (ref 0.3–1.2)
Bilirubin, Direct: <0.2 mg/dL (ref 0.0–0.3)
Total Protein: 7.2 g/dL (ref 6.0–8.3)

## 2013-10-26 LAB — GLUCOSE, CAPILLARY
GLUCOSE-CAPILLARY: 228 mg/dL — AB (ref 70–99)
GLUCOSE-CAPILLARY: 204 mg/dL — AB (ref 70–99)
Glucose-Capillary: 142 mg/dL — ABNORMAL HIGH (ref 70–99)

## 2013-10-26 SURGERY — DENTAL RESTORATION/EXTRACTION WITH X-RAY
Anesthesia: General | Site: Mouth

## 2013-10-26 MED ORDER — LACTATED RINGERS IV SOLN
INTRAVENOUS | Status: DC
  Administered 2013-10-26: 10:00:00 via INTRAVENOUS

## 2013-10-26 MED ORDER — FENTANYL CITRATE 0.05 MG/ML IJ SOLN
INTRAMUSCULAR | Status: DC | PRN
  Administered 2013-10-26: 50 ug via INTRAVENOUS

## 2013-10-26 MED ORDER — KETOROLAC TROMETHAMINE 30 MG/ML IJ SOLN
INTRAMUSCULAR | Status: DC | PRN
  Administered 2013-10-26: 30 mg via INTRAVENOUS

## 2013-10-26 MED ORDER — GLYCOPYRROLATE 0.2 MG/ML IJ SOLN
INTRAMUSCULAR | Status: DC | PRN
  Administered 2013-10-26: 0.4 mg via INTRAVENOUS

## 2013-10-26 MED ORDER — INSULIN ASPART 100 UNIT/ML ~~LOC~~ SOLN
SUBCUTANEOUS | Status: AC
  Filled 2013-10-26: qty 1

## 2013-10-26 MED ORDER — NEOSTIGMINE METHYLSULFATE 10 MG/10ML IV SOLN
INTRAVENOUS | Status: DC | PRN
  Administered 2013-10-26: 3 mg via INTRAVENOUS

## 2013-10-26 MED ORDER — PROPOFOL 10 MG/ML IV BOLUS
INTRAVENOUS | Status: AC
  Filled 2013-10-26: qty 20

## 2013-10-26 MED ORDER — ONDANSETRON HCL 4 MG/2ML IJ SOLN
INTRAMUSCULAR | Status: DC | PRN
  Administered 2013-10-26: 4 mg via INTRAVENOUS

## 2013-10-26 MED ORDER — KETOROLAC TROMETHAMINE 30 MG/ML IJ SOLN
INTRAMUSCULAR | Status: AC
Start: 2013-10-26 — End: 2013-10-26
  Filled 2013-10-26: qty 1

## 2013-10-26 MED ORDER — MIDAZOLAM HCL 2 MG/2ML IJ SOLN
INTRAMUSCULAR | Status: AC
  Filled 2013-10-26: qty 2

## 2013-10-26 MED ORDER — FENTANYL CITRATE 0.05 MG/ML IJ SOLN: 25.0000 ug | INTRAMUSCULAR | Status: DC | PRN

## 2013-10-26 MED ORDER — FENTANYL CITRATE 0.05 MG/ML IJ SOLN
INTRAMUSCULAR | Status: AC
  Filled 2013-10-26: qty 2

## 2013-10-26 MED ORDER — LIDOCAINE HCL (CARDIAC) 20 MG/ML IV SOLN
INTRAVENOUS | Status: DC | PRN
  Administered 2013-10-26: 100 mg via INTRAVENOUS

## 2013-10-26 MED ORDER — LACTATED RINGERS IV SOLN: INTRAVENOUS | Status: DC

## 2013-10-26 MED ORDER — PROPOFOL 10 MG/ML IV BOLUS
INTRAVENOUS | Status: DC | PRN
  Administered 2013-10-26: 120 mg via INTRAVENOUS

## 2013-10-26 MED ORDER — ROCURONIUM BROMIDE 100 MG/10ML IV SOLN
INTRAVENOUS | Status: DC | PRN
  Administered 2013-10-26: 30 mg via INTRAVENOUS

## 2013-10-26 MED ORDER — FENTANYL CITRATE 0.05 MG/ML IJ SOLN
INTRAMUSCULAR | Status: AC
  Filled 2013-10-26: qty 5

## 2013-10-26 MED ORDER — HEMOSTATIC AGENTS (NO CHARGE) OPTIME
TOPICAL | Status: DC | PRN
  Administered 2013-10-26: 1 via TOPICAL

## 2013-10-26 MED ORDER — PROMETHAZINE HCL 25 MG/ML IJ SOLN: 6.2500 mg | INTRAMUSCULAR | Status: DC | PRN

## 2013-10-26 MED ORDER — INSULIN ASPART 100 UNIT/ML ~~LOC~~ SOLN
10.0000 [IU] | Freq: Once | SUBCUTANEOUS | Status: AC
  Administered 2013-10-26: 10 [IU] via INTRAVENOUS

## 2013-10-26 MED ORDER — ONDANSETRON HCL 4 MG/2ML IJ SOLN
INTRAMUSCULAR | Status: AC
  Filled 2013-10-26: qty 2

## 2013-10-26 MED ORDER — EPHEDRINE SULFATE 50 MG/ML IJ SOLN
INTRAMUSCULAR | Status: DC | PRN
  Administered 2013-10-26 (×3): 5 mg via INTRAVENOUS

## 2013-10-26 MED ORDER — ROCURONIUM BROMIDE 50 MG/5ML IV SOLN
INTRAVENOUS | Status: AC
  Filled 2013-10-26: qty 1

## 2013-10-26 MED ORDER — LIDOCAINE-EPINEPHRINE 2 %-1:100000 IJ SOLN
INTRAMUSCULAR | Status: DC | PRN
  Administered 2013-10-26: 30 mL via INTRADERMAL

## 2013-10-26 MED ORDER — DEXMEDETOMIDINE HCL 200 MCG/2ML IV SOLN
INTRAVENOUS | Status: DC | PRN
  Administered 2013-10-26: 4 ug via INTRAVENOUS
  Administered 2013-10-26: 20 ug via INTRAVENOUS
  Administered 2013-10-26: 4 ug via INTRAVENOUS

## 2013-10-26 MED ORDER — MEPERIDINE HCL 25 MG/ML IJ SOLN: 6.2500 mg | INTRAMUSCULAR | Status: DC | PRN

## 2013-10-26 MED ORDER — GLYCOPYRROLATE 0.2 MG/ML IJ SOLN
INTRAMUSCULAR | Status: AC
  Filled 2013-10-26: qty 2

## 2013-10-26 MED ORDER — ARTIFICIAL TEARS OP OINT
TOPICAL_OINTMENT | OPHTHALMIC | Status: AC
  Filled 2013-10-26: qty 3.5

## 2013-10-26 SURGICAL SUPPLY — 23 items
BLADE 10 SAFETY STRL DISP (BLADE) ×3 IMPLANT
CANISTER SUCTION 2500CC (MISCELLANEOUS) ×3 IMPLANT
COVER PROBE W GEL 5X96 (DRAPES) ×3 IMPLANT
COVER SURGICAL LIGHT HANDLE (MISCELLANEOUS) ×3 IMPLANT
COVER TABLE BACK 60X90 (DRAPES) ×3 IMPLANT
DRAPE PROXIMA HALF (DRAPES) ×3 IMPLANT
GAUZE SPONGE 4X4 16PLY XRAY LF (GAUZE/BANDAGES/DRESSINGS) ×3 IMPLANT
GLOVE BIO SURGEON STRL SZ7.5 (GLOVE) ×5 IMPLANT
GOWN STRL REUS W/ TWL LRG LVL3 (GOWN DISPOSABLE) ×2 IMPLANT
GOWN STRL REUS W/TWL LRG LVL3 (GOWN DISPOSABLE) ×6
KIT BASIN OR (CUSTOM PROCEDURE TRAY) ×3 IMPLANT
KIT ROOM TURNOVER OR (KITS) ×3 IMPLANT
NEEDLE 27GAX1X1/2 (NEEDLE) ×2 IMPLANT
PAD ARMBOARD 7.5X6 YLW CONV (MISCELLANEOUS) ×6 IMPLANT
SPONGE SURGIFOAM ABS GEL SZ50 (HEMOSTASIS) ×3 IMPLANT
SUT CHROMIC 3 0 PS 2 (SUTURE) ×4 IMPLANT
SYR CONTROL 10ML LL (SYRINGE) ×2 IMPLANT
TOOTHBRUSH ADULT (PERSONAL CARE ITEMS) ×3 IMPLANT
TOWEL OR 17X26 10 PK STRL BLUE (TOWEL DISPOSABLE) ×3 IMPLANT
TUBE CONNECTING 12'X1/4 (SUCTIONS) ×1
TUBE CONNECTING 12X1/4 (SUCTIONS) ×2 IMPLANT
WATER STERILE IRR 1000ML POUR (IV SOLUTION) ×3 IMPLANT
YANKAUER SUCT BULB TIP NO VENT (SUCTIONS) ×3 IMPLANT

## 2013-10-26 NOTE — H&P (Signed)
H&P reviewed, no changes in assessment  

## 2013-10-26 NOTE — Anesthesia Postprocedure Evaluation (Signed)
  Anesthesia Post-op Note  Patient: Justin Frey  Procedure(s) Performed: Procedure(s): RECALL EXAM, FULL MOUTH X-RAYS, DEBRIDEMENT, EXTRACTION  OF # 4,H, 26 AND COMPOSITE #3,5 AND 19. (N/A)  Patient Location: PACU  Anesthesia Type:General  Level of Consciousness: awake and alert   Airway and Oxygen Therapy: Patient Spontanous Breathing and Patient connected to nasal cannula oxygen  Post-op Pain: none  Post-op Assessment: Post-op Vital signs reviewed, Patient's Cardiovascular Status Stable, Respiratory Function Stable, Patent Airway and No signs of Nausea or vomiting  Post-op Vital Signs: Reviewed and stable  Last Vitals:  Filed Vitals:   10/26/13 1215  BP:   Pulse: 61  Temp:   Resp: 12    Complications: No apparent anesthesia complications

## 2013-10-26 NOTE — Progress Notes (Signed)
CBG 328 Dr Berneice Heinrich called and informed with new orders noted.

## 2013-10-26 NOTE — Op Note (Signed)
NAMEMACKIE, HOLNESS NO.:  [id number]  MEDICAL RECORD NO.:  [id number]  LOCATION:  [facility]                         FACILITY:  [facility]  PHYSICIAN:  Esaw Dace., D.D.S.DATE OF BIRTH:  [birth year]  DATE OF PROCEDURE:  10/26/2013 DATE OF DISCHARGE:  10/26/2013                              OPERATIVE REPORT   PREOPERATIVE DIAGNOSIS:  Dental caries/behavior management issues due to intellectual/developmental disabilities.  Dental care provided in the OR for medically necessary treatment.  OPERATION:  Full mouth oral rehabilitation including exam, x-rays, cleaning, extractions.  OPERATIVE SURGEON:  Azucena Freed, D.D.S.  ASSISTANT:  Laqueta Carina and hospital staff.  ANESTHESIA:  General.  PROCEDURE IN DETAIL:  The patient was brought into the operating room and placed in supine position.  General anesthesia was administered via nasal intubation.  The patient was prepped and draped in usual manner for an intraoral general dentistry procedure.  Oropharynx was suctioned and moistened, posterior throat pack was placed.  A full intraoral exam including all hard and soft tissues was performed.  This was a recall exam.  The soft tissue exam reveals the floor of the mouth, buccal mucosa, soft palate, hard palate, tongue, gingival, and frenum attachments all within normal limits with regard to size, color, and consistency.  Hard tissue exam reveals tooth #1 missing, tooth #2 through #5 present, #6 uninterrupted, #7 through #10 present, H present, #11 uninterrupted, #12 through #15 present, #16 and #17 missing, tooth #18 through #32 present.  A full mouth series of digital x-rays was taken and full mouth debridement was completed.  Operative care was provided with a high-speed handpiece #4 bur.  Composites were completed on tooth #3, #5, and #19.  Extractions were completed on #4, H, and #26. Gelfoam was inserted in the extraction sites and 3-0 chromic  suture was used to close the size 3 mL of 2% lidocaine with 1:100,000 epinephrine was given.  Estimated blood loss of 10 mL.  The mouth was suctioned dry and a posterior throat pack was carefully removed with constant suction. The patient was awakened in the OR and transferred to the recovery room in good condition.  An extraction sheet was filled out by the patient's representative.     Esaw Dace., D.D.S.     WEM/MEDQ  D:  10/26/2013  T:  10/26/2013  Job:  [id number]

## 2013-10-26 NOTE — Discharge Instructions (Signed)
Discharge to home when stable. 

## 2013-10-26 NOTE — Transfer of Care (Signed)
Immediate Anesthesia Transfer of Care Note  Patient: Justin Frey  Procedure(s) Performed: Procedure(s): RECALL EXAM, FULL MOUTH X-RAYS, DEBRIDEMENT, EXTRACTION  OF # 4,H, 26 AND COMPOSITE #3,5 AND 19. (N/A)  Patient Location: PACU  Anesthesia Type:General  Level of Consciousness: awake and alert   Airway & Oxygen Therapy: Patient Spontanous Breathing  Post-op Assessment: Report given to PACU RN and Post -op Vital signs reviewed and stable  Post vital signs: Reviewed and stable  Complications: No apparent anesthesia complications

## 2013-10-26 NOTE — Op Note (Signed)
Recall Exam, FMX, FMD, Extractions #4,H,26,  Composites #3,5,19

## 2013-10-26 NOTE — Anesthesia Preprocedure Evaluation (Signed)
Anesthesia Evaluation  Patient identified by MRN, date of birth, ID band Patient awake    Reviewed: Allergy & Precautions, H&P , NPO status , Patient's Chart, lab work & pertinent test results  Airway       Dental   Pulmonary  OSA not on CPAP         Cardiovascular hypertension, Pt. on medications  NO heart HX   Neuro/Psych PSYCHIATRIC DISORDERS    GI/Hepatic   Endo/Other  diabetes, Poorly Controlled, Type 2  Renal/GU      Musculoskeletal   Abdominal   Peds  Hematology   Anesthesia Other Findings   Reproductive/Obstetrics                           Anesthesia Physical Anesthesia Plan  ASA: III  Anesthesia Plan: General   Post-op Pain Management:    Induction: Intravenous  Airway Management Planned: Oral ETT and Nasal ETT  Additional Equipment:   Intra-op Plan:   Post-operative Plan:   Informed Consent: I have reviewed the patients History and Physical, chart, labs and discussed the procedure including the risks, benefits and alternatives for the proposed anesthesia with the patient or authorized representative who has indicated his/her understanding and acceptance.     Plan Discussed with:   Anesthesia Plan Comments: (Morbidly obese, will do multinodal pain RX to lessen narcotics, Need to verify C-Spine films to be done today and Liver panel and sugar this am.  IF LFT OK will use Tylenol, also ketamine, precedex, decadron, no toradol platlets 124)        Anesthesia Quick Evaluation

## 2013-10-26 NOTE — Anesthesia Procedure Notes (Signed)
Procedure Name: Intubation Date/Time: 10/26/2013 10:20 AM Performed by: Margaree Mackintosh Pre-anesthesia Checklist: Patient identified, Emergency Drugs available, Suction available, Patient being monitored and Timeout performed Patient Re-evaluated:Patient Re-evaluated prior to inductionOxygen Delivery Method: Circle system utilized Preoxygenation: Pre-oxygenation with 100% oxygen Intubation Type: Combination inhalational/ intravenous induction Ventilation: Mask ventilation without difficulty and Oral airway inserted - appropriate to patient size Laryngoscope size: Small adult glidescope. Grade View: Grade I Tube type: Oral Tube size: 6.5 mm Number of attempts: 1 Airway Equipment and Method: Stylet and Video-laryngoscopy Placement Confirmation: ETT inserted through vocal cords under direct vision,  positive ETCO2 and breath sounds checked- equal and bilateral Secured at: 20 cm Tube secured with: Tape Dental Injury: Teeth and Oropharynx as per pre-operative assessment

## 2013-10-26 NOTE — Brief Op Note (Signed)
10/26/2013  12:05 PM  PATIENT:  Justin Frey  43 y.o. male  PRE-OPERATIVE DIAGNOSIS:  dental caries  POST-OPERATIVE DIAGNOSIS:  dental caries  PROCEDURE:  Procedure(s): RECALL EXAM, FULL MOUTH X-RAYS, DEBRIDEMENT, EXTRACTION  OF # 4,H, 26 AND COMPOSITE #3,5 AND 19. (N/A)  SURGEON:  Surgeon(s) and Role:    * Esaw Dace., DDS - Primary  PHYSICIAN ASSISTANT:   ASSISTANTS: Laqueta Carina   ANESTHESIA:   general  EBL:     BLOOD ADMINISTERED:none  DRAINS: none   LOCAL MEDICATIONS USED:  XYLOCAINE   SPECIMEN:  No Specimen  DISPOSITION OF SPECIMEN:  N/A  COUNTS:  YES  TOURNIQUET:  * No tourniquets in log *  DICTATION: .Note written in EPIC  PLAN OF CARE: Discharge to home after PACU  PATIENT DISPOSITION:  PACU - hemodynamically stable.   Delay start of Pharmacological VTE agent (>24hrs) due to surgical blood loss or risk of bleeding: not applicable

## 2013-10-30 ENCOUNTER — Encounter (HOSPITAL_COMMUNITY): Payer: Self-pay | Admitting: Dentistry

## 2017-04-13 ENCOUNTER — Telehealth: Payer: Self-pay | Admitting: Hematology

## 2017-04-13 NOTE — Telephone Encounter (Signed)
Appt has been scheduled for the pt to see Dr. Candise CheKale on 3/13 at 11am. [facility]. Aware that the pt should arrive 30 minutes early for check-in.

## 2017-04-19 NOTE — Progress Notes (Signed)
HEMATOLOGY/ONCOLOGY CONSULTATION NOTE  Date of Service: 04/21/2017  Patient Care Team: Gilda Crease, MD as PCP - General (Specialist)  [facility]: [phone] PCP Dr. Katina Dung: [phone]  CHIEF COMPLAINTS/PURPOSE OF CONSULTATION:  Pancytopenia  HISTORY OF PRESENTING ILLNESS:  Justin Frey is a wonderful 47 y.o. male who has been referred to [place] by Dr. Nicolasa Ducking from [facility] for evaluation and management of pancytopenia.   The pt has down syndrome and is accompanied by a Financial controller from [employer]. The [facility] employee reports that [name] guardian is his brother, and helps makes medical decisions on his behalf. The [facility] employee notes that his mother previously made medical decision on [name] behalf until her death about 2 years ago. The accompanying [facility] worker is unable to make any decisions on the patients behalf.  Patient is unable to provide any clinical information and does not have decisional capacity.   The [facility] employee notes that [name] has had no concerns for bleeding, and she notes that he has been eating well in general.   Most recent lab results (03/24/17) of CBC  is as follows: all values are WNL except for WBC at 2.3k, RBC at 4.05, Platelets at 57k, Neutrophils at 1.2k.  A 10/20/13 CBC reveals Platelets at 124k, but all other values were WNL  The pt is noted to take 100mg  Topamax.   On review of systems, pt reports some abdominal pain.   On PMHx the pt is noted to have Down's Syndrome and other medical co-morbidities as noted above.  MEDICAL HISTORY:  Past Medical History:  Diagnosis Date  . Diabetes mellitus without complication (HCC)    fasting 150-200  . Down's syndrome   . Hearing loss   . Hypertension   . Hypothyroidism   . Impulse control disorder   . Keratolysis bullosa hereditaria    his mother denied knowledge of this  . MR (mental retardation), severe   . Myopia   . Narcolepsy   .  OCD (obsessive compulsive disorder)   . Sleep apnea    will not wear cpap  . Thyroid disease    hypothyroid    SURGICAL HISTORY: Past Surgical History:  Procedure Laterality Date  . DENTAL RESTORATION/EXTRACTION WITH X-RAY N/A 10/26/2013   Procedure: RECALL EXAM, FULL MOUTH X-RAYS, DEBRIDEMENT, EXTRACTION  OF # 4,H, 26 AND COMPOSITE #3,5 AND 19.;  Surgeon: Esaw Dace., DDS;  Location: [facility] OR;  Service: Oral Surgery;  Laterality: N/A;    SOCIAL HISTORY: Social History   Socioeconomic History  . Marital status: Single    Spouse name: Not on file  . Number of children: Not on file  . Years of education: Not on file  . Highest education level: Not on file  Social Needs  . Financial resource strain: Not on file  . Food insecurity - worry: Not on file  . Food insecurity - inability: Not on file  . Transportation needs - medical: Not on file  . Transportation needs - non-medical: Not on file  Occupational History  . Not on file  Tobacco Use  . Smoking status: Never Smoker  . Smokeless tobacco: Never Used  Substance and Sexual Activity  . Alcohol use: No  . Drug use: No  . Sexual activity: No  Other Topics Concern  . Not on file  Social History Narrative  . Not on file    FAMILY HISTORY: History reviewed. No pertinent family history.  ALLERGIES:  has No Known Allergies.  MEDICATIONS:  Current Outpatient Medications  Medication Sig Dispense Refill  . amLODipine (NORVASC) 2.5 MG tablet Take 2.5 mg by mouth daily.    Marland Kitchen. aspirin 81 MG chewable tablet Chew 81 mg by mouth daily.    . enalapril (VASOTEC) 2.5 MG tablet Take 2.5 mg by mouth daily. Hold if BP <90/60    . risperiDONE (RISPERDAL) 1 MG tablet Take 1 mg by mouth 2 (two) times daily.     Marland Kitchen. topiramate (TOPAMAX) 100 MG tablet Take 100 mg by mouth 3 (three) times daily.    . calcium-vitamin D (OYSTER CALCIUM 500 + D) 500-200 MG-UNIT per tablet Take 1 tablet by mouth 2 (two) times daily.    . diclofenac sodium  (VOLTAREN) 1 % GEL Apply 2 g topically 2 (two) times daily.    Marland Kitchen. escitalopram (LEXAPRO) 20 MG tablet Take 20 mg by mouth every morning.    Marland Kitchen. glucose blood test strip 1 each by Other route See admin instructions. Check blood sugar twice daily.    Marland Kitchen. levothyroxine (SYNTHROID, LEVOTHROID) 88 MCG tablet Take 88 mcg by mouth daily before breakfast.    . metFORMIN (GLUCOPHAGE) 500 MG tablet Take 500 mg by mouth 2 (two) times daily with a meal.    . Methylcellulose, Laxative, (CITRUCEL) 500 MG TABS Take 1,000 mg by mouth daily.    . nabumetone (RELAFEN) 750 MG tablet Take 750 mg by mouth daily.    . propranolol ER (INDERAL LA) 160 MG SR capsule Take 160 mg by mouth daily.    . propranolol ER (INDERAL LA) 60 MG 24 hr capsule Take 60 mg by mouth at bedtime.    . simvastatin (ZOCOR) 20 MG tablet Take 20 mg by mouth daily.    . temazepam (RESTORIL) 30 MG capsule Take 30 mg by mouth at bedtime.     No current facility-administered medications for this visit.     REVIEW OF SYSTEMS:    10 Point review of Systems was done is negative except as noted above.  PHYSICAL EXAMINATION: ECOG PERFORMANCE STATUS: 2 - Symptomatic, <50% confined to bed  . Vitals:   04/21/17 1141  BP: 99/62  Pulse: 75  Resp: 20  Temp: 98 F (36.7 C)  SpO2: 98%   Filed Weights   04/21/17 1141  Weight: 124 lb 6.4 oz (56.4 kg)   .Body mass index is 34.64 kg/m.  GENERAL:alert, in no acute distress and comfortable SKIN: no acute rashes, no significant lesions EYES: conjunctiva are pink and non-injected, sclera anicteric OROPHARYNX: MMM, no exudates, no oropharyngeal erythema or ulceration NECK: supple, no JVD LYMPH:  no palpable lymphadenopathy in the cervical, axillary or inguinal regions LUNGS: clear to auscultation b/l with normal respiratory effort HEART: regular rate & rhythm ABDOMEN:  normoactive bowel sounds , non tender, not distended. Extremity: no pedal edema PSYCH: alert & oriented x 3 with fluent  speech NEURO: no focal motor/sensory deficits  LABORATORY DATA:  I have reviewed the data as listed  . CBC Latest Ref Rng & Units 10/20/2013 05/26/2012  WBC 4.0 - 10.5 K/uL 6.4 -  Hemoglobin 13.0 - 17.0 g/dL 91.414.4 78.215.0  Hematocrit 95.639.0 - 52.0 % 41.6 44.0  Platelets 150 - 400 K/uL 124(L) -    . CMP Latest Ref Rng & Units 10/26/2013 10/20/2013 05/26/2012  Glucose 70 - 99 mg/dL - 213(Y281(H) 865(H148(H)  BUN 6 - 23 mg/dL - 14 17  Creatinine 8.460.50 - 1.35 mg/dL - 9.620.78 9.520.80  Sodium 137 - 147 mEq/L - 136(L) 139  Potassium 3.7 - 5.3 mEq/L - 4.5 4.1  Chloride 96 - 112 mEq/L - 100 105  CO2 19 - 32 mEq/L - 23 -  Calcium 8.4 - 10.5 mg/dL - 8.9 -  Total Protein 6.0 - 8.3 g/dL 7.2 - -  Total Bilirubin 0.3 - 1.2 mg/dL 0.4 - -  Alkaline Phos 39 - 117 U/L 59 - -  AST 0 - 37 U/L 36 - -  ALT 0 - 53 U/L 39 - -   03/24/17 CBC Labs:    RADIOGRAPHIC STUDIES: I have personally reviewed the radiological images as listed and agreed with the findings in the report. No results found.  ASSESSMENT & PLAN:  47 y.o. male with  1. Pancytopenia ? The differential diagnosis if very broad at this time Medications - Risperdone, topamax vs primary BM disorder associated with his Down Syndrome's (MDS /AML) -Review of Mr. Krisher labs shows current pancytopenia, but previous labs were not made available at time of visit to put the current lab finding in proper context.   -We reached out to [facility] services today and let them know that we need to speak to the patient's PCP and power of attorney. -We let [facility] services know that at Mr. Eller next visit, his POA will need to be present as Mr. Montefusco does not have decisional capacity and there would be several elements of goals of care that will need to be defined to determine extent of workup. -No labs can be taken today without speaking with POA.  -Greater lab history and context of care will be needed and we would like to see pt return to care and be accompanied by his  POA. PLAN -would recommend patient PCP discuss with patient POA and determine his goals of care in the context of his significantly compromised mental status/Downs syndrome and psychiatrist issues. -if his goals of care allow for extensive lab workup and Bone marrow examination he will need these to further determine the etiology of his pancytopenia. -he will also need CT chest/abd/pelvis to evaluate for LNadenopathy/liver disease and splenomegaly. -we will see him back if his PCP determines this workup and related management is in keeping with patients goals of care as determined by PCP in tandem with POA.   All of the patients questions were answered with apparent satisfaction. The patient knows to call the clinic with any problems, questions or concerns.  I spent 20 minutes counseling the patient face to face. The total time spent in the appointment was 25 minutes and more than 50% was on counseling and direct patient cares.    Wyvonnia Lora MD MS AAHIVMS [facility] Hematology/Oncology Physician [facility]  (Office):       [phone] (Work cell):  [phone] (Fax):           [phone]  04/21/2017 11:56 AM  This document serves as a record of services personally performed by Wyvonnia Lora, MD. It was created on his behalf by Marcelline Mates, a trained medical scribe. The creation of this record is based on the scribe's personal observations and the provider's statements to them.   .I have reviewed the above documentation for accuracy and completeness, and I agree with the above. Johney Maine MD MS

## 2017-04-21 ENCOUNTER — Encounter: Payer: Self-pay | Admitting: Hematology

## 2017-04-21 ENCOUNTER — Inpatient Hospital Stay: Payer: Medicare Other | Attending: Hematology | Admitting: Hematology

## 2017-04-21 VITALS — BP 99/62 | HR 75 | Temp 98.0°F | Resp 20 | Ht <= 58 in | Wt 124.4 lb

## 2017-04-21 DIAGNOSIS — D61818 Other pancytopenia: Secondary | ICD-10-CM | POA: Diagnosis present

## 2017-04-21 DIAGNOSIS — E039 Hypothyroidism, unspecified: Secondary | ICD-10-CM | POA: Diagnosis not present

## 2017-04-21 DIAGNOSIS — Z79899 Other long term (current) drug therapy: Secondary | ICD-10-CM | POA: Diagnosis not present

## 2017-04-21 DIAGNOSIS — Q909 Down syndrome, unspecified: Secondary | ICD-10-CM | POA: Insufficient documentation

## 2017-04-22 ENCOUNTER — Telehealth: Payer: Self-pay

## 2017-04-22 NOTE — Telephone Encounter (Signed)
Mailed calender and letter to patient. Per 3/13 los

## 2017-04-22 NOTE — Telephone Encounter (Signed)
Attempt to get in touch with Tabitha at [facility] [phone] in order to get number for pt's legal guardian or HCPOA in order to make sure this individual is present at next appt. Currently, f/u scheduled for 3/28 at 1300, but attempting to confirm with HCPOA. No answer and no VM upon attempt to call.

## 2017-04-22 NOTE — Telephone Encounter (Signed)
Called the home to give new appointment date, and was direct to call the agency unable to get through or leave a message. Added contact information for [facility]  To patient chart. [facility] Per 3/13 los

## 2017-05-06 ENCOUNTER — Ambulatory Visit: Payer: Self-pay | Admitting: Hematology

## 2017-05-07 ENCOUNTER — Telehealth: Payer: Self-pay

## 2017-05-07 NOTE — Telephone Encounter (Signed)
Pt rescheduled with Dr. Gweneth DimitriPerlov on 05/11/17. Spoke with Dr. Gweneth DimitriPerlov and desk RN regarding need to get in touch with [facility] and family contact number/name to make sure that pt's legal guardian is present to help make decisions. Dr. Gweneth DimitriPerlov aware of pt add on. Number for [facility] given to Belenda CruiseKristin, RN to attempt contact.

## 2017-05-11 ENCOUNTER — Inpatient Hospital Stay: Payer: Medicare Other | Attending: Hematology | Admitting: Hematology and Oncology

## 2017-05-11 ENCOUNTER — Inpatient Hospital Stay: Payer: Medicare Other

## 2017-05-11 VITALS — BP 92/62 | HR 73 | Temp 97.5°F | Resp 20 | Ht <= 58 in | Wt 125.2 lb

## 2017-05-11 DIAGNOSIS — D61818 Other pancytopenia: Secondary | ICD-10-CM | POA: Diagnosis not present

## 2017-05-11 DIAGNOSIS — E039 Hypothyroidism, unspecified: Secondary | ICD-10-CM

## 2017-05-11 DIAGNOSIS — E119 Type 2 diabetes mellitus without complications: Secondary | ICD-10-CM

## 2017-05-11 LAB — CMP (CANCER CENTER ONLY)
ALBUMIN: 3.9 g/dL (ref 3.5–5.0)
ALK PHOS: 58 U/L (ref 40–150)
ALT: 13 U/L (ref 0–55)
AST: 16 U/L (ref 5–34)
Anion gap: 6 (ref 3–11)
BILIRUBIN TOTAL: 0.5 mg/dL (ref 0.2–1.2)
BUN: 15 mg/dL (ref 7–26)
CO2: 27 mmol/L (ref 22–29)
Calcium: 9.8 mg/dL (ref 8.4–10.4)
Chloride: 109 mmol/L (ref 98–109)
Creatinine: 1.14 mg/dL (ref 0.70–1.30)
GFR, Est AFR Am: >60 mL/min (ref >60)
GFR, Estimated: >60 mL/min (ref >60)
GLUCOSE: 150 mg/dL — AB (ref 70–140)
POTASSIUM: 5.1 mmol/L (ref 3.5–5.1)
Sodium: 142 mmol/L (ref 136–145)
TOTAL PROTEIN: 7.7 g/dL (ref 6.4–8.3)

## 2017-05-11 LAB — CBC WITH DIFFERENTIAL (CANCER CENTER ONLY)
BASOS ABS: 0 10*3/uL (ref 0.0–0.1)
Basophils Relative: 1 %
EOS ABS: 0 10*3/uL (ref 0.0–0.5)
Eosinophils Relative: 1 %
HCT: 40 % (ref 38.4–49.9)
Hemoglobin: 13.2 g/dL (ref 13.0–17.1)
LYMPHS PCT: 31 %
Lymphs Abs: 1.6 10*3/uL (ref 0.9–3.3)
MCH: 31.4 pg (ref 27.2–33.4)
MCHC: 33 g/dL (ref 32.0–36.0)
MCV: 95.2 fL (ref 79.3–98.0)
MONO ABS: 0.5 10*3/uL (ref 0.1–0.9)
Monocytes Relative: 9 %
Neutro Abs: 3 10*3/uL (ref 1.5–6.5)
Neutrophils Relative %: 58 %
PLATELETS: 82 10*3/uL — AB (ref 140–400)
RBC: 4.2 MIL/uL (ref 4.20–5.82)
RDW: 14.2 % (ref 11.0–14.6)
WBC Count: 5.2 10*3/uL (ref 4.0–10.3)

## 2017-05-11 LAB — MAGNESIUM: Magnesium: 1.9 mg/dL (ref 1.5–2.5)

## 2017-05-18 ENCOUNTER — Telehealth: Payer: Self-pay | Admitting: Hematology

## 2017-05-18 ENCOUNTER — Ambulatory Visit: Payer: Self-pay | Admitting: Hematology and Oncology

## 2017-05-18 NOTE — Telephone Encounter (Signed)
Faxed office visit notes from 05/11/17 as requested. Release [id number]

## 2017-05-24 ENCOUNTER — Encounter: Payer: Self-pay | Admitting: Hematology and Oncology

## 2017-05-24 DIAGNOSIS — D61818 Other pancytopenia: Secondary | ICD-10-CM | POA: Insufficient documentation

## 2017-05-24 NOTE — Progress Notes (Signed)
[facility] New Visit:  Assessment: Other pancytopenia ([place]) 47 y.o. male with Down syndrome, but otherwise minimal past medical history presenting with an isolated episode of leukopenia with isolated neutropenia of mild degree as well as significant thrombocytopenia.  Differential of the bicytopenia is broad and includes medication side effect especially considering topiramate that patient is taking which is associated with bone marrow suppression, but a more careful tracking of the hematological trends would be beneficial.  Additionally, significant abnormalities in the peripheral blood warrant investigation with bone marrow biopsy to assess for primary hematological abnormalities as well as CT of the chest/abdomen/pelvis to assess for possible presence of lymphoproliferative condition.  These procedures would require consent.  Patient is unable to provide his consent, his primary caregivers are also not empowered to make medical decisions on his behalf.  He is current active power of attorney as are his brothers neither of whom are present during this conversation and were unavailable on the phone.  Plan: -Obtain repeat labs today to assess for change in hematological profile. -The facility staff is to make further efforts in contacting patient's brothers to allow [place] to have a conversation with them regarding further care for the patient. - Return to our clinic in 1 week with brothers in attendance to further discuss management strategies.   Voice recognition software was used and creation of this note. Despite my best effort at editing the text, some misspelling/errors may have occurred. Orders Placed This Encounter  Procedures  . CBC with Differential (Cancer Center Only)    Standing Status:   Future    Number of Occurrences:   1    Standing Expiration Date:   05/12/2018  . CMP ([facility] only)    Standing Status:   Future    Number of Occurrences:   1   Standing Expiration Date:   05/12/2018  . Magnesium    Standing Status:   Future    Number of Occurrences:   1    Standing Expiration Date:   05/11/2018    All questions were answered.  . The patient knows to call the clinic with any problems, questions or concerns.  This note was electronically signed.    History of Presenting Illness EINER MEALS is a 47 y.o. male followed in the [facility] for "pancytopenia", referred by Lillia Corporal from [employer] [employer].   Patient was previously seen and evaluated by Dr. Irene Limbo.  Patient's past medical history is significant for Down syndrome.  He lacks decisional capacity with healthcare decisions usually made by his brother as patient's parents are now deceased (mother) or are otherwise unable to participate in his care (father). Broden comes to the clinic accompanied by a worker from [employer].   Likely only complaint today is what appears to be epigastric abdominal pain.  He denies nausea, diarrhea, blood in his stool.  Denies dysuria or hematuria.  Denies fevers, chills or night sweats.  He reports good appetite and no significant weight loss.  Oncological/hematological History: --Labs, 10/20/13: WBC 6.4, ANC   ..., Hgb 14.4,       Plt 124 --Labs, 03/24/17: WBC 2.3, ANC 1.2, Hgb 13.1, MCV 97.0, MCH 32.3, RDW 15.4, Plt   57  Medical History: Past Medical History:  Diagnosis Date  . Diabetes mellitus without complication (HCC)    fasting 150-200  . Down's syndrome   . Hearing loss   . Hypertension   . Hypothyroidism   . Impulse control  disorder   . Keratolysis bullosa hereditaria    his mother denied knowledge of this  . MR (mental retardation), severe   . Myopia   . Narcolepsy   . OCD (obsessive compulsive disorder)   . Sleep apnea    will not wear cpap  . Thyroid disease    hypothyroid    Surgical History: Past Surgical History:  Procedure Laterality Date  . DENTAL RESTORATION/EXTRACTION WITH X-RAY N/A  10/26/2013   Procedure: RECALL EXAM, FULL MOUTH X-RAYS, DEBRIDEMENT, EXTRACTION  OF # 4,H, 26 AND COMPOSITE #3,5 AND 19.;  Surgeon: Jamison Oka., DDS;  Location: [facility];  Service: Oral Surgery;  Laterality: N/A;    Family History: No family history on file.  Social History: Social History   Socioeconomic History  . Marital status: Single    Spouse name: Not on file  . Number of children: Not on file  . Years of education: Not on file  . Highest education level: Not on file  Occupational History  . Not on file  Social Needs  . Financial resource strain: Not on file  . Food insecurity:    Worry: Not on file    Inability: Not on file  . Transportation needs:    Medical: Not on file    Non-medical: Not on file  Tobacco Use  . Smoking status: Never Smoker  . Smokeless tobacco: Never Used  Substance and Sexual Activity  . Alcohol use: No  . Drug use: No  . Sexual activity: Never  Lifestyle  . Physical activity:    Days per week: Not on file    Minutes per session: Not on file  . Stress: Not on file  Relationships  . Social connections:    Talks on phone: Not on file    Gets together: Not on file    Attends religious service: Not on file    Active member of club or organization: Not on file    Attends meetings of clubs or organizations: Not on file    Relationship status: Not on file  . Intimate partner violence:    Fear of current or ex partner: Not on file    Emotionally abused: Not on file    Physically abused: Not on file    Forced sexual activity: Not on file  Other Topics Concern  . Not on file  Social History Narrative  . Not on file    Allergies: No Known Allergies  Medications:  Current Outpatient Medications  Medication Sig Dispense Refill  . amLODipine (NORVASC) 2.5 MG tablet Take 2.5 mg by mouth daily.    Marland Kitchen aspirin 81 MG chewable tablet Chew 81 mg by mouth daily.    . calcium-vitamin D (OYSTER CALCIUM 500 + D) 500-200 MG-UNIT per tablet Take  1 tablet by mouth 2 (two) times daily.    . diclofenac sodium (VOLTAREN) 1 % GEL Apply 2 g topically 2 (two) times daily.    . enalapril (VASOTEC) 2.5 MG tablet Take 2.5 mg by mouth daily. Hold if BP <90/60    . escitalopram (LEXAPRO) 20 MG tablet Take 20 mg by mouth every morning.    Marland Kitchen glucose blood test strip 1 each by Other route See admin instructions. Check blood sugar twice daily.    Marland Kitchen levothyroxine (SYNTHROID, LEVOTHROID) 88 MCG tablet Take 88 mcg by mouth daily before breakfast.    . metFORMIN (GLUCOPHAGE) 500 MG tablet Take 500 mg by mouth 2 (two) times daily with a meal.    .  Methylcellulose, Laxative, (CITRUCEL) 500 MG TABS Take 1,000 mg by mouth daily.    . nabumetone (RELAFEN) 750 MG tablet Take 750 mg by mouth daily.    . propranolol ER (INDERAL LA) 160 MG SR capsule Take 160 mg by mouth daily.    . propranolol ER (INDERAL LA) 60 MG 24 hr capsule Take 60 mg by mouth at bedtime.    . risperiDONE (RISPERDAL) 1 MG tablet Take 1 mg by mouth 2 (two) times daily.     . simvastatin (ZOCOR) 20 MG tablet Take 20 mg by mouth daily.    . temazepam (RESTORIL) 30 MG capsule Take 30 mg by mouth at bedtime.    . topiramate (TOPAMAX) 100 MG tablet Take 100 mg by mouth 3 (three) times daily.     No current facility-administered medications for this visit.     Review of Systems: Review of Systems  Gastrointestinal: Positive for abdominal pain.  All other systems reviewed and are negative.    PHYSICAL EXAMINATION Blood pressure 92/62, pulse 73, temperature (!) 97.5 F (36.4 C), temperature source Oral, resp. rate 20, height _0  (1.27 m), weight 125 lb 3.2 oz (56.8 kg), SpO2 100 %.  ECOG PERFORMANCE STATUS: 1 - Symptomatic but completely ambulatory  Physical Exam  Constitutional: No distress.  Middle-aged male with apparent Down syndrome.  Interactive, pleasant, does not appear to be in significant discomfort.  HENT:  Head: Normocephalic and atraumatic.  Mouth/Throat: Oropharynx is  clear and moist. No oropharyngeal exudate.  Eyes: Pupils are equal, round, and reactive to light. Conjunctivae and EOM are normal. No scleral icterus.  Neck: No thyromegaly present.  Cardiovascular: Normal rate, regular rhythm and normal heart sounds.  Pulmonary/Chest: Effort normal and breath sounds normal. No respiratory distress. He has no wheezes. He has no rales.  Abdominal: Soft. Bowel sounds are normal. He exhibits no distension and no mass. There is no tenderness. No hernia.  Musculoskeletal: He exhibits no edema.  Lymphadenopathy:    He has no cervical adenopathy.  Neurological: He is alert. He displays normal reflexes. No cranial nerve deficit or sensory deficit.  Skin: Skin is warm and dry. No rash noted. He is not diaphoretic. No erythema. No pallor.     LABORATORY DATA: I have personally reviewed the data as listed: Appointment on 05/11/2017  Component Date Value Ref Range Status  . Magnesium 05/11/2017 1.9  1.5 - 2.5 mg/dL Final   Performed at [facility], [place] [address], [place], [place] [address]  . Sodium 05/11/2017 142  136 - 145 mmol/L Final  . Potassium 05/11/2017 5.1  3.5 - 5.1 mmol/L Final  . Chloride 05/11/2017 109  98 - 109 mmol/L Final  . CO2 05/11/2017 27  22 - 29 mmol/L Final  . Glucose, Bld 05/11/2017 150* 70 - 140 mg/dL Final  . BUN 05/11/2017 15  7 - 26 mg/dL Final  . Creatinine 05/11/2017 1.14  0.70 - 1.30 mg/dL Final  . Calcium 05/11/2017 9.8  8.4 - 10.4 mg/dL Final  . Total Protein 05/11/2017 7.7  6.4 - 8.3 g/dL Final  . Albumin 05/11/2017 3.9  3.5 - 5.0 g/dL Final  . AST 05/11/2017 16  5 - 34 U/L Final  . ALT 05/11/2017 13  0 - 55 U/L Final  . Alkaline Phosphatase 05/11/2017 58  40 - 150 U/L Final  . Total Bilirubin 05/11/2017 0.5  0.2 - 1.2 mg/dL Final  . GFR, Est Non Af Am 05/11/2017 >60  >60 mL/min  Final  . GFR, Est AFR Am 05/11/2017 >60  >60 mL/min Final   Comment: (NOTE) The eGFR has been calculated using the CKD EPI  equation. This calculation has not been validated in all clinical situations. eGFR's persistently <60 mL/min signify possible Chronic Kidney Disease.   Georgiann Hahn gap 05/11/2017 6  3 - 11 Final   Performed at [facility], [place] [address] [place], [place] [address]  . WBC Count 05/11/2017 5.2  4.0 - 10.3 K/uL Final  . RBC 05/11/2017 4.20  4.20 - 5.82 MIL/uL Final  . Hemoglobin 05/11/2017 13.2  13.0 - 17.1 g/dL Final  . HCT 05/11/2017 40.0  38.4 - 49.9 % Final  . MCV 05/11/2017 95.2  79.3 - 98.0 fL Final  . MCH 05/11/2017 31.4  27.2 - 33.4 pg Final  . MCHC 05/11/2017 33.0  32.0 - 36.0 g/dL Final  . RDW 05/11/2017 14.2  11.0 - 14.6 % Final  . Platelet Count 05/11/2017 82* 140 - 400 K/uL Final  . Neutrophils Relative % 05/11/2017 58  % Final  . Neutro Abs 05/11/2017 3.0  1.5 - 6.5 K/uL Final  . Lymphocytes Relative 05/11/2017 31  % Final  . Lymphs Abs 05/11/2017 1.6  0.9 - 3.3 K/uL Final  . Monocytes Relative 05/11/2017 9  % Final  . Monocytes Absolute 05/11/2017 0.5  0.1 - 0.9 K/uL Final  . Eosinophils Relative 05/11/2017 1  % Final  . Eosinophils Absolute 05/11/2017 0.0  0.0 - 0.5 K/uL Final  . Basophils Relative 05/11/2017 1  % Final  . Basophils Absolute 05/11/2017 0.0  0.0 - 0.1 K/uL Final   Performed at [facility], [place] [address] [place], [place] [address]         Ardath Sax, MD

## 2017-05-24 NOTE — Assessment & Plan Note (Signed)
47 y.o. male with Down syndrome, but otherwise minimal past medical history presenting with an isolated episode of leukopenia with isolated neutropenia of mild degree as well as significant thrombocytopenia.  Differential of the bicytopenia is broad and includes medication side effect especially considering topiramate that patient is taking which is associated with bone marrow suppression, but a more careful tracking of the hematological trends would be beneficial.  Additionally, significant abnormalities in the peripheral blood warrant investigation with bone marrow biopsy to assess for primary hematological abnormalities as well as CT of the chest/abdomen/pelvis to assess for possible presence of lymphoproliferative condition.  These procedures would require consent.  Patient is unable to provide his consent, his primary caregivers are also not empowered to make medical decisions on his behalf.  He is current active power of attorney as are his brothers neither of whom are present during this conversation and were unavailable on the phone.  Plan: -Obtain repeat labs today to assess for change in hematological profile. -The facility staff is to make further efforts in contacting patient's brothers to allow [place] to have a conversation with them regarding further care for the patient. - Return to our clinic in 1 week with brothers in attendance to further discuss management strategies.

## 2017-10-08 ENCOUNTER — Telehealth: Payer: Self-pay

## 2017-10-08 NOTE — Telephone Encounter (Signed)
Per 8/30 RN Dorene Grebe(Natalie) requested to schedule a appointment based on Perlov last orders for patient to return with a power of attorney

## 2017-10-29 ENCOUNTER — Other Ambulatory Visit: Payer: Self-pay

## 2017-10-29 DIAGNOSIS — D61818 Other pancytopenia: Secondary | ICD-10-CM

## 2017-11-01 ENCOUNTER — Inpatient Hospital Stay: Payer: Medicare Other

## 2017-11-01 ENCOUNTER — Inpatient Hospital Stay: Payer: Medicare Other | Attending: Hematology | Admitting: Hematology

## 2017-11-01 VITALS — BP 107/52 | HR 67 | Temp 98.7°F | Resp 18 | Ht <= 58 in | Wt 120.1 lb

## 2017-11-01 DIAGNOSIS — D61818 Other pancytopenia: Secondary | ICD-10-CM

## 2017-11-01 DIAGNOSIS — E039 Hypothyroidism, unspecified: Secondary | ICD-10-CM | POA: Insufficient documentation

## 2017-11-01 DIAGNOSIS — D696 Thrombocytopenia, unspecified: Secondary | ICD-10-CM | POA: Diagnosis present

## 2017-11-01 DIAGNOSIS — E119 Type 2 diabetes mellitus without complications: Secondary | ICD-10-CM | POA: Diagnosis not present

## 2017-11-01 LAB — CMP (CANCER CENTER ONLY)
ALBUMIN: 3.8 g/dL (ref 3.5–5.0)
ALT: 8 U/L (ref 0–44)
AST: 12 U/L — AB (ref 15–41)
Alkaline Phosphatase: 60 U/L (ref 38–126)
Anion gap: 8 (ref 5–15)
BILIRUBIN TOTAL: 0.5 mg/dL (ref 0.3–1.2)
BUN: 18 mg/dL (ref 6–20)
CO2: 24 mmol/L (ref 22–32)
CREATININE: 1.11 mg/dL (ref 0.61–1.24)
Calcium: 9.4 mg/dL (ref 8.9–10.3)
Chloride: 110 mmol/L (ref 98–111)
GFR, Est AFR Am: >60 mL/min (ref >60)
GFR, Estimated: >60 mL/min (ref >60)
Glucose, Bld: 115 mg/dL — ABNORMAL HIGH (ref 70–99)
POTASSIUM: 4.6 mmol/L (ref 3.5–5.1)
Sodium: 142 mmol/L (ref 135–145)
TOTAL PROTEIN: 7.2 g/dL (ref 6.5–8.1)

## 2017-11-01 LAB — CBC WITH DIFFERENTIAL (CANCER CENTER ONLY)
Basophils Absolute: 0 10*3/uL (ref 0.0–0.1)
Basophils Relative: 1 %
EOS PCT: 1 %
Eosinophils Absolute: 0 10*3/uL (ref 0.0–0.5)
HCT: 40.4 % (ref 38.4–49.9)
Hemoglobin: 13.5 g/dL (ref 13.0–17.1)
LYMPHS PCT: 25 %
Lymphs Abs: 1.2 10*3/uL (ref 0.9–3.3)
MCH: 32 pg (ref 27.2–33.4)
MCHC: 33.4 g/dL (ref 32.0–36.0)
MCV: 95.7 fL (ref 79.3–98.0)
MONO ABS: 0.3 10*3/uL (ref 0.1–0.9)
Monocytes Relative: 5 %
Neutro Abs: 3.3 10*3/uL (ref 1.5–6.5)
Neutrophils Relative %: 68 %
PLATELETS: 78 10*3/uL — AB (ref 140–400)
RBC: 4.22 MIL/uL (ref 4.20–5.82)
RDW: 14.1 % (ref 11.0–14.6)
WBC Count: 4.8 10*3/uL (ref 4.0–10.3)

## 2017-11-01 LAB — MAGNESIUM: Magnesium: 1.6 mg/dL — ABNORMAL LOW (ref 1.7–2.4)

## 2017-11-01 NOTE — Progress Notes (Signed)
Due to patient's limited communication skills, information obtained from brother and caregiver.

## 2017-11-01 NOTE — Progress Notes (Signed)
HEMATOLOGY/ONCOLOGY CLINIC NOTE  Date of Service: 11/01/2017  Patient Care Team: Gilda CreasePavelock, Richard M, MD as PCP - General (Specialist)  [facility]: [phone] PCP Dr. Katina DungHoover Royals: [phone]  CHIEF COMPLAINTS/PURPOSE OF CONSULTATION:  Pancytopenia  HISTORY OF PRESENTING ILLNESS:  Justin Frey is a wonderful 47 y.o. male who has been referred to us by Dr. Nicolasa Duckingichard Pavelock [facility] for evaluation and management of pancytopenia.   The pt has down syndrome and is accompanied by a Financial controllerworker from [facility]. The [facility] employee reports that Mr. [name]'s guardian is his brother, and helps makes medical decisions on his behalf. The [facility] employee notes that his mother previously made medical decision on Mr. [name]'s behalf until her death about 2 years ago. The accompanying [facility] worker is unable to make any decisions on the patients behalf.  Patient is unable to provide any clinical information and does not have decisional capacity.   The [facility] employee notes that Mr. [name] has had no concerns for bleeding, and she notes that he has been eating well in general.   Most recent lab results (03/24/17) of CBC  is as follows: all values are WNL except for WBC at 2.3k, RBC at 4.05, Platelets at 57k, Neutrophils at 1.2k.  A 10/20/13 CBC reveals Platelets at 124k, but all other values were WNL  The pt is noted to take 100mg  Topamax.   On review of systems, pt reports some abdominal pain.   On PMHx the pt is noted to have Down's Syndrome and other medical co-morbidities as noted above.  Interval History:   Justin Frey returns today for management and evaluation of his thrombocytopenia. The patient's last visit with us was on 04/21/17. He is accompanied today by his brother who is his POA, the patient's caregiver, and the patient's aunt. The pt reports that he is doing well overall.   Had a detailed discussion with the patient's brother who is his POA, and the  patient's aunt, and caregiver, about the goals of care and considerations of work up   Lab results today (11/01/17) of CBC w/diff, CMP, and Reticulocytes is as follows: all values are WNL except for PLT at 78k, Glucose at 115, AST at 12. 11/01/17 Magnesium at 1.6  MEDICAL HISTORY:  Past Medical History:  Diagnosis Date  . Diabetes mellitus without complication (HCC)    fasting 150-200  . Down's syndrome   . Hearing loss   . Hypertension   . Hypothyroidism   . Impulse control disorder   . Keratolysis bullosa hereditaria    his mother denied knowledge of this  . MR (mental retardation), severe   . Myopia   . Narcolepsy   . OCD (obsessive compulsive disorder)   . Sleep apnea    will not wear cpap  . Thyroid disease    hypothyroid    SURGICAL HISTORY: Past Surgical History:  Procedure Laterality Date  . DENTAL RESTORATION/EXTRACTION WITH X-RAY N/A 10/26/2013   Procedure: RECALL EXAM, FULL MOUTH X-RAYS, DEBRIDEMENT, EXTRACTION  OF # 4,H, 26 AND COMPOSITE #3,5 AND 19.;  Surgeon: Esaw DaceWilliam E Milner Jr., DDS;  Location: [facility] OR;  Service: Oral Surgery;  Laterality: N/A;    SOCIAL HISTORY: Social History   Socioeconomic History  . Marital status: Single    Spouse name: Not on file  . Number of children: Not on file  . Years of education: Not on file  . Highest education level: Not on file  Occupational History  .  Not on file  Social Needs  . Financial resource strain: Not on file  . Food insecurity:    Worry: Not on file    Inability: Not on file  . Transportation needs:    Medical: Not on file    Non-medical: Not on file  Tobacco Use  . Smoking status: Never Smoker  . Smokeless tobacco: Never Used  Substance and Sexual Activity  . Alcohol use: No  . Drug use: No  . Sexual activity: Never  Lifestyle  . Physical activity:    Days per week: Not on file    Minutes per session: Not on file  . Stress: Not on file  Relationships  . Social connections:    Talks on phone:  Not on file    Gets together: Not on file    Attends religious service: Not on file    Active member of club or organization: Not on file    Attends meetings of clubs or organizations: Not on file    Relationship status: Not on file  . Intimate partner violence:    Fear of current or ex partner: Not on file    Emotionally abused: Not on file    Physically abused: Not on file    Forced sexual activity: Not on file  Other Topics Concern  . Not on file  Social History Narrative  . Not on file    FAMILY HISTORY: No family history on file.  ALLERGIES:  has No Known Allergies.  MEDICATIONS:  Current Outpatient Medications  Medication Sig Dispense Refill  . amLODipine (NORVASC) 2.5 MG tablet Take 2.5 mg by mouth daily.    Marland Kitchen aspirin 81 MG chewable tablet Chew 81 mg by mouth daily.    . calcium-vitamin D (OYSTER CALCIUM 500 + D) 500-200 MG-UNIT per tablet Take 1 tablet by mouth 2 (two) times daily.    . diclofenac sodium (VOLTAREN) 1 % GEL Apply 2 g topically 2 (two) times daily.    . enalapril (VASOTEC) 2.5 MG tablet Take 2.5 mg by mouth daily. Hold if BP <90/60    . escitalopram (LEXAPRO) 20 MG tablet Take 20 mg by mouth every morning.    Marland Kitchen glucose blood test strip 1 each by Other route See admin instructions. Check blood sugar twice daily.    Marland Kitchen levothyroxine (SYNTHROID, LEVOTHROID) 88 MCG tablet Take 88 mcg by mouth daily before breakfast.    . metFORMIN (GLUCOPHAGE) 500 MG tablet Take 500 mg by mouth 2 (two) times daily with a meal.    . Methylcellulose, Laxative, (CITRUCEL) 500 MG TABS Take 1,000 mg by mouth daily.    . nabumetone (RELAFEN) 750 MG tablet Take 750 mg by mouth daily.    Marland Kitchen omeprazole (PRILOSEC) 20 MG capsule Take 20 mg by mouth daily.    . propranolol ER (INDERAL LA) 160 MG SR capsule Take 160 mg by mouth daily.    . propranolol ER (INDERAL LA) 60 MG 24 hr capsule Take 60 mg by mouth at bedtime.    . risperiDONE (RISPERDAL) 1 MG tablet Take 1 mg by mouth 2 (two) times  daily.     . simvastatin (ZOCOR) 20 MG tablet Take 20 mg by mouth daily.    . temazepam (RESTORIL) 30 MG capsule Take 30 mg by mouth at bedtime.    . topiramate (TOPAMAX) 100 MG tablet Take 100 mg by mouth 3 (three) times daily.     No current facility-administered medications for this visit.  REVIEW OF SYSTEMS:    A 10+ POINT REVIEW OF SYSTEMS WAS OBTAINED including neurology, dermatology, psychiatry, cardiac, respiratory, lymph, extremities, GI, GU, Musculoskeletal, constitutional, breasts, reproductive, HEENT.  All pertinent positives are noted in the HPI.  All others are negative.   PHYSICAL EXAMINATION: ECOG PERFORMANCE STATUS: 2 - Symptomatic, <50% confined to bed  . Vitals:   11/01/17 1407  BP: (!) 107/52  Pulse: 67  Resp: 18  Temp: 98.7 F (37.1 C)  SpO2: 98%   Filed Weights   11/01/17 1407  Weight: 120 lb 1.6 oz (54.5 kg)   .Body mass index is 33.78 kg/m.  GENERAL:alert, in no acute distress and comfortable SKIN: no acute rashes, no significant lesions EYES: conjunctiva are pink and non-injected, sclera anicteric OROPHARYNX: MMM, no exudates, no oropharyngeal erythema or ulceration NECK: supple, no JVD LYMPH:  no palpable lymphadenopathy in the cervical, axillary or inguinal regions LUNGS: clear to auscultation b/l with normal respiratory effort HEART: regular rate & rhythm ABDOMEN:  normoactive bowel sounds , non tender, not distended. No palpable hepatosplenomegaly.  Extremity: no pedal edema PSYCH: alert & oriented x 3 with fluent speech NEURO: no focal motor/sensory deficits   LABORATORY DATA:  I have reviewed the data as listed  . CBC Latest Ref Rng & Units 11/01/2017 05/11/2017 10/20/2013  WBC 4.0 - 10.3 K/uL 4.8 5.2 6.4  Hemoglobin 13.0 - 17.1 g/dL 16.1 09.6 04.5  Hematocrit 38.4 - 49.9 % 40.4 40.0 41.6  Platelets 140 - 400 K/uL 78(L) 82(L) 124(L)    . CMP Latest Ref Rng & Units 11/01/2017 05/11/2017 10/26/2013  Glucose 70 - 99 mg/dL 409(W) 119(J)  -  BUN 6 - 20 mg/dL 18 15 -  Creatinine 4.78 - 1.24 mg/dL 2.95 6.21 -  Sodium 308 - 145 mmol/L 142 142 -  Potassium 3.5 - 5.1 mmol/L 4.6 5.1 -  Chloride 98 - 111 mmol/L 110 109 -  CO2 22 - 32 mmol/L 24 27 -  Calcium 8.9 - 10.3 mg/dL 9.4 9.8 -  Total Protein 6.5 - 8.1 g/dL 7.2 7.7 7.2  Total Bilirubin 0.3 - 1.2 mg/dL 0.5 0.5 0.4  Alkaline Phos 38 - 126 U/L 60 58 59  AST 15 - 41 U/L 12(L) 16 36  ALT 0 - 44 U/L 8 13 39   03/24/17 CBC Labs:    RADIOGRAPHIC STUDIES: I have personally reviewed the radiological images as listed and agreed with the findings in the report. No results found.  ASSESSMENT & PLAN:  47 y.o. male with  1. Thrombocytopenia - medications vs ITP ? The differential diagnosis if very broad at this time Medications - Risperdone, topamax vs primary BM disorder associated with his Down Syndrome's (MDS /AML) PLAN:  -Discussed pt labwork today, 11/01/17; no leukopenia, no anemia, PLT at 78k -Discussed that his Down syndrome does place him at risk for AML and MDS -See if possible to back off on medications that are known to suppress bone marrow like Risperdal and Topamax  -Discussed that the treatment for immune thrombocytopenia would be Prednisone if platelet <50k or concerns for bleeding. -Recommend that the pt be evaluated for Vitamin B12 def given use of Metformin -Would like for liver disease to be ruled out as well with US Abdomen -Had a detailed discussion with the patient's brother who is his POA, and the patient's aunt, and caregiver, about the goals of care and considerations of work up -Brother Liberato Stansbery contact at [phone] -Aunt Kartik Fernando Shammah at [phone] -Will watch  labs again in 4  months   RTC with Dr Candise Che in 4 months with labs    All of the patients questions were answered with apparent satisfaction. The patient knows to call the clinic with any problems, questions or concerns.  The total time spent in the appt was 35 minutes and  more than 50% was on counseling and direct patient cares.    Wyvonnia Lora MD MS AAHIVMS [facility] Hematology/Oncology Physician [facility]  (Office):       [phone] (Work cell):  [phone] (Fax):           [phone]  11/01/2017 3:28 PM  I, Marcelline Mates, am acting as a scribe for Dr. Candise Che  .I have reviewed the above documentation for accuracy and completeness, and I agree with the above. Johney Maine MD

## 2017-11-02 ENCOUNTER — Telehealth: Payer: Self-pay

## 2017-11-02 NOTE — Telephone Encounter (Signed)
Per 9/23 los scheduled patient to return in 4 months. Called and was unable to leave a msg. Mailed a letter with a calender enclosed.

## 2017-11-26 ENCOUNTER — Telehealth: Payer: Self-pay | Admitting: *Deleted

## 2017-11-26 NOTE — Telephone Encounter (Signed)
Medical records faxed to [facility]; release [id number]

## 2018-01-04 ENCOUNTER — Other Ambulatory Visit: Payer: Self-pay

## 2018-01-04 ENCOUNTER — Inpatient Hospital Stay (HOSPITAL_COMMUNITY)
Admission: EM | Admit: 2018-01-04 | Discharge: 2018-01-09 | DRG: 208 | Disposition: E | Payer: Medicare Other | Attending: Pulmonary Disease | Admitting: Pulmonary Disease

## 2018-01-04 ENCOUNTER — Encounter (HOSPITAL_COMMUNITY): Payer: Self-pay | Admitting: *Deleted

## 2018-01-04 ENCOUNTER — Emergency Department (HOSPITAL_COMMUNITY): Payer: Medicare Other

## 2018-01-04 ENCOUNTER — Inpatient Hospital Stay (HOSPITAL_COMMUNITY): Payer: Medicare Other

## 2018-01-04 DIAGNOSIS — I10 Essential (primary) hypertension: Secondary | ICD-10-CM | POA: Diagnosis present

## 2018-01-04 DIAGNOSIS — G253 Myoclonus: Secondary | ICD-10-CM | POA: Diagnosis present

## 2018-01-04 DIAGNOSIS — T17920A Food in respiratory tract, part unspecified causing asphyxiation, initial encounter: Secondary | ICD-10-CM | POA: Diagnosis present

## 2018-01-04 DIAGNOSIS — Z515 Encounter for palliative care: Secondary | ICD-10-CM | POA: Diagnosis not present

## 2018-01-04 DIAGNOSIS — Z66 Do not resuscitate: Secondary | ICD-10-CM | POA: Diagnosis not present

## 2018-01-04 DIAGNOSIS — I468 Cardiac arrest due to other underlying condition: Secondary | ICD-10-CM | POA: Diagnosis present

## 2018-01-04 DIAGNOSIS — Z79890 Hormone replacement therapy (postmenopausal): Secondary | ICD-10-CM

## 2018-01-04 DIAGNOSIS — I959 Hypotension, unspecified: Secondary | ICD-10-CM | POA: Diagnosis present

## 2018-01-04 DIAGNOSIS — E119 Type 2 diabetes mellitus without complications: Secondary | ICD-10-CM | POA: Diagnosis present

## 2018-01-04 DIAGNOSIS — Q909 Down syndrome, unspecified: Secondary | ICD-10-CM | POA: Diagnosis not present

## 2018-01-04 DIAGNOSIS — I469 Cardiac arrest, cause unspecified: Secondary | ICD-10-CM | POA: Diagnosis not present

## 2018-01-04 DIAGNOSIS — Z7984 Long term (current) use of oral hypoglycemic drugs: Secondary | ICD-10-CM | POA: Diagnosis not present

## 2018-01-04 DIAGNOSIS — T17928A Food in respiratory tract, part unspecified causing other injury, initial encounter: Secondary | ICD-10-CM

## 2018-01-04 DIAGNOSIS — H9190 Unspecified hearing loss, unspecified ear: Secondary | ICD-10-CM | POA: Diagnosis present

## 2018-01-04 DIAGNOSIS — Z9289 Personal history of other medical treatment: Secondary | ICD-10-CM

## 2018-01-04 DIAGNOSIS — G4733 Obstructive sleep apnea (adult) (pediatric): Secondary | ICD-10-CM | POA: Diagnosis present

## 2018-01-04 DIAGNOSIS — G931 Anoxic brain damage, not elsewhere classified: Secondary | ICD-10-CM | POA: Diagnosis present

## 2018-01-04 DIAGNOSIS — J9601 Acute respiratory failure with hypoxia: Secondary | ICD-10-CM | POA: Diagnosis present

## 2018-01-04 DIAGNOSIS — J690 Pneumonitis due to inhalation of food and vomit: Secondary | ICD-10-CM

## 2018-01-04 DIAGNOSIS — E039 Hypothyroidism, unspecified: Secondary | ICD-10-CM | POA: Diagnosis present

## 2018-01-04 DIAGNOSIS — Z7189 Other specified counseling: Secondary | ICD-10-CM | POA: Diagnosis not present

## 2018-01-04 DIAGNOSIS — R579 Shock, unspecified: Secondary | ICD-10-CM | POA: Diagnosis not present

## 2018-01-04 HISTORY — DX: Unspecified astigmatism, unspecified eye: H52.209

## 2018-01-04 LAB — CBC WITH DIFFERENTIAL/PLATELET
Abs Immature Granulocytes: 0.78 10*3/uL — ABNORMAL HIGH (ref 0.00–0.07)
Basophils Absolute: 0.1 10*3/uL (ref 0.0–0.1)
Basophils Relative: 1 %
EOS PCT: 0 %
Eosinophils Absolute: 0 10*3/uL (ref 0.0–0.5)
HCT: 43.4 % (ref 39.0–52.0)
HEMOGLOBIN: 13.5 g/dL (ref 13.0–17.0)
Immature Granulocytes: 7 %
LYMPHS PCT: 23 %
Lymphs Abs: 2.4 10*3/uL (ref 0.7–4.0)
MCH: 30.9 pg (ref 26.0–34.0)
MCHC: 31.1 g/dL (ref 30.0–36.0)
MCV: 99.3 fL (ref 80.0–100.0)
MONO ABS: 0.6 10*3/uL (ref 0.1–1.0)
MONOS PCT: 5 %
Neutro Abs: 6.9 10*3/uL (ref 1.7–7.7)
Neutrophils Relative %: 64 %
Platelets: UNDETERMINED 10*3/uL (ref 150–400)
RBC: 4.37 MIL/uL (ref 4.22–5.81)
RDW: 13.3 % (ref 11.5–15.5)
WBC: 10.7 10*3/uL — ABNORMAL HIGH (ref 4.0–10.5)
nRBC: 0.2 % (ref 0.0–0.2)

## 2018-01-04 LAB — I-STAT CHEM 8, ED
BUN: 16 mg/dL (ref 6–20)
Calcium, Ion: 1.17 mmol/L (ref 1.15–1.40)
Chloride: 106 mmol/L (ref 98–111)
Creatinine, Ser: 1.3 mg/dL — ABNORMAL HIGH (ref 0.61–1.24)
Glucose, Bld: 257 mg/dL — ABNORMAL HIGH (ref 70–99)
HEMATOCRIT: 40 % (ref 39.0–52.0)
HEMOGLOBIN: 13.6 g/dL (ref 13.0–17.0)
POTASSIUM: 3.9 mmol/L (ref 3.5–5.1)
SODIUM: 139 mmol/L (ref 135–145)
TCO2: 22 mmol/L (ref 22–32)

## 2018-01-04 LAB — I-STAT CG4 LACTIC ACID, ED: LACTIC ACID, VENOUS: 4.04 mmol/L — AB (ref 0.5–1.9)

## 2018-01-04 LAB — BASIC METABOLIC PANEL
Anion gap: 10 (ref 5–15)
BUN: 15 mg/dL (ref 6–20)
CHLORIDE: 109 mmol/L (ref 98–111)
CO2: 19 mmol/L — ABNORMAL LOW (ref 22–32)
CREATININE: 1.45 mg/dL — AB (ref 0.61–1.24)
Calcium: 8.3 mg/dL — ABNORMAL LOW (ref 8.9–10.3)
GFR calc Af Amer: >60 mL/min (ref >60)
GFR, EST NON AFRICAN AMERICAN: 57 mL/min — AB (ref >60)
GLUCOSE: 262 mg/dL — AB (ref 70–99)
Potassium: 3.9 mmol/L (ref 3.5–5.1)
SODIUM: 138 mmol/L (ref 135–145)

## 2018-01-04 LAB — I-STAT TROPONIN, ED: Troponin i, poc: 0 ng/mL (ref 0.00–0.08)

## 2018-01-04 LAB — PROTIME-INR
INR: 1.26
Prothrombin Time: 15.7 seconds — ABNORMAL HIGH (ref 11.4–15.2)

## 2018-01-04 LAB — I-STAT ARTERIAL BLOOD GAS, ED
Acid-base deficit: 5 mmol/L — ABNORMAL HIGH (ref 0.0–2.0)
Bicarbonate: 21.9 mmol/L (ref 20.0–28.0)
O2 Saturation: 97 %
PCO2 ART: 48.7 mmHg — AB (ref 32.0–48.0)
PH ART: 7.26 — AB (ref 7.350–7.450)
TCO2: 23 mmol/L (ref 22–32)
pO2, Arterial: 105 mmHg (ref 83.0–108.0)

## 2018-01-04 LAB — GLUCOSE, CAPILLARY
GLUCOSE-CAPILLARY: 287 mg/dL — AB (ref 70–99)
GLUCOSE-CAPILLARY: 108 mg/dL — AB (ref 70–99)
Glucose-Capillary: 196 mg/dL — ABNORMAL HIGH (ref 70–99)

## 2018-01-04 LAB — MRSA PCR SCREENING: MRSA BY PCR: NEGATIVE

## 2018-01-04 MED ORDER — LACTATED RINGERS IV SOLN
INTRAVENOUS | Status: DC
  Administered 2018-01-04: 50 mL/h via INTRAVENOUS
  Administered 2018-01-05: 10:00:00 via INTRAVENOUS

## 2018-01-04 MED ORDER — LEVETIRACETAM IN NACL 500 MG/100ML IV SOLN
500.0000 mg | Freq: Two times a day (BID) | INTRAVENOUS | Status: DC
  Administered 2018-01-05: 500 mg via INTRAVENOUS
  Filled 2018-01-04: qty 100

## 2018-01-04 MED ORDER — MIDAZOLAM HCL 2 MG/2ML IJ SOLN
1.0000 mg | INTRAMUSCULAR | Status: DC | PRN
  Administered 2018-01-04: 2 mg via INTRAVENOUS
  Administered 2018-01-04: 1 mg via INTRAVENOUS
  Administered 2018-01-04 (×4): 2 mg via INTRAVENOUS
  Administered 2018-01-04 (×2): 1 mg via INTRAVENOUS
  Administered 2018-01-04 (×2): 2 mg via INTRAVENOUS
  Filled 2018-01-04 (×10): qty 2

## 2018-01-04 MED ORDER — HEPARIN SODIUM (PORCINE) 5000 UNIT/ML IJ SOLN
5000.0000 [IU] | Freq: Three times a day (TID) | INTRAMUSCULAR | Status: DC
  Administered 2018-01-04 – 2018-01-05 (×3): 5000 [IU] via SUBCUTANEOUS
  Filled 2018-01-04 (×3): qty 1

## 2018-01-04 MED ORDER — CHLORHEXIDINE GLUCONATE 0.12% ORAL RINSE (MEDLINE KIT)
15.0000 mL | Freq: Two times a day (BID) | OROMUCOSAL | Status: DC
  Administered 2018-01-04 – 2018-01-05 (×3): 15 mL via OROMUCOSAL

## 2018-01-04 MED ORDER — INSULIN ASPART 100 UNIT/ML ~~LOC~~ SOLN
0.0000 [IU] | SUBCUTANEOUS | Status: DC
  Administered 2018-01-04: 2 [IU] via SUBCUTANEOUS
  Administered 2018-01-04: 5 [IU] via SUBCUTANEOUS
  Administered 2018-01-05 (×2): 1 [IU] via SUBCUTANEOUS

## 2018-01-04 MED ORDER — FENTANYL 2500MCG IN NS 250ML (10MCG/ML) PREMIX INFUSION
0.0000 ug/h | INTRAVENOUS | Status: DC
  Administered 2018-01-04: 50 ug/h via INTRAVENOUS
  Administered 2018-01-04: 25 ug/h via INTRAVENOUS
  Administered 2018-01-05: 150 ug/h via INTRAVENOUS
  Filled 2018-01-04 (×2): qty 250

## 2018-01-04 MED ORDER — ONDANSETRON HCL 4 MG/2ML IJ SOLN: 4.0000 mg | Freq: Four times a day (QID) | INTRAMUSCULAR | Status: DC | PRN

## 2018-01-04 MED ORDER — CHLORHEXIDINE GLUCONATE 0.12 % MT SOLN
OROMUCOSAL | Status: AC
  Filled 2018-01-04: qty 15

## 2018-01-04 MED ORDER — LEVETIRACETAM IN NACL 1000 MG/100ML IV SOLN
1000.0000 mg | Freq: Once | INTRAVENOUS | Status: AC
  Administered 2018-01-05: 1000 mg via INTRAVENOUS
  Filled 2018-01-04: qty 100

## 2018-01-04 MED ORDER — ORAL CARE MOUTH RINSE
15.0000 mL | OROMUCOSAL | Status: DC
  Administered 2018-01-04 – 2018-01-05 (×12): 15 mL via OROMUCOSAL

## 2018-01-04 MED ORDER — SODIUM CHLORIDE 0.9 % IV SOLN
1.5000 g | Freq: Four times a day (QID) | INTRAVENOUS | Status: DC
  Administered 2018-01-04 – 2018-01-05 (×6): 1.5 g via INTRAVENOUS
  Filled 2018-01-04 (×8): qty 1.5

## 2018-01-04 MED ORDER — NOREPINEPHRINE 4 MG/250ML-% IV SOLN
0.0000 ug/min | INTRAVENOUS | Status: DC
  Administered 2018-01-04: 5 ug/min via INTRAVENOUS
  Administered 2018-01-04 – 2018-01-05 (×2): 10 ug/min via INTRAVENOUS
  Filled 2018-01-04 (×3): qty 250

## 2018-01-04 MED ORDER — NOREPINEPHRINE 4 MG/250ML-% IV SOLN
INTRAVENOUS | Status: AC
  Filled 2018-01-04: qty 250

## 2018-01-04 MED ORDER — LEVOTHYROXINE SODIUM 88 MCG PO TABS
88.0000 ug | ORAL_TABLET | Freq: Every day | ORAL | Status: DC
  Administered 2018-01-05: 88 ug
  Filled 2018-01-04: qty 1

## 2018-01-04 MED ORDER — FAMOTIDINE 40 MG/5ML PO SUSR
20.0000 mg | Freq: Two times a day (BID) | ORAL | Status: DC
  Administered 2018-01-04 – 2018-01-05 (×3): 20 mg
  Filled 2018-01-04 (×4): qty 2.5

## 2018-01-04 MED ORDER — ACETAMINOPHEN 325 MG PO TABS
650.0000 mg | ORAL_TABLET | ORAL | Status: DC | PRN
  Administered 2018-01-05: 650 mg via ORAL
  Filled 2018-01-04: qty 2

## 2018-01-04 NOTE — ED Notes (Signed)
Versed wasted with Bryon LionsNicole Janiesha Diehl and Maud DeedWendy Childress.

## 2018-01-04 NOTE — ED Notes (Signed)
Ice packs applied to bil groin and axilla area per Pfieffer, MD, unable to insert temp foley d/t anatomy and size of temp foley, PICU does not have smaller size that allows temp monitoring, 2 H to send esophageal probe for tem monitoring

## 2018-01-04 NOTE — ED Notes (Signed)
RT at bedside.

## 2018-01-04 NOTE — ED Notes (Signed)
CC PA verbal to remove ice packs that we would not be doing a Code Cool

## 2018-01-04 NOTE — Progress Notes (Signed)
EEG Completed; Results Pending  

## 2018-01-04 NOTE — Progress Notes (Signed)
Pharmacy Antibiotic Note  Justin Frey is a 47 y.o. male admitted on 01/01/2018 with aspiration pneumonia.  Pharmacy has been consulted for Unasyn dosing. WBC 10.7. LA 4.04. SCr 1.3.  Plan: -Start Unasyn 1.5 gm IV Q 6 hours -Monitor clinical progress and culture results   Height: 4\' 6"  (137.2 cm) IBW/kg (Calculated) : 36.2  No data recorded.  Recent Labs  Lab 04/12/2017 1012 04/12/2017 1028 04/12/2017 1029  CREATININE 1.45* 1.30*  --   LATICACIDVEN  --   --  4.04*    CrCl cannot be calculated (Unknown ideal weight.).    No Known Allergies  Antimicrobials this admission: Unasyn 12/26 >>    Thank you for allowing pharmacy to be a part of this patient's care.  Justin Frey, PharmD., BCPS Clinical Pharmacist Clinical phone for 04/12/2017 until 3:30pm: [phone] If after 3:30pm, please [facility] for unit-specific pharmacist

## 2018-01-04 NOTE — ED Notes (Signed)
Family at bedside discussing plan of care with Critical care doctors

## 2018-01-04 NOTE — H&P (Addendum)
NAME:  Justin Frey, MRN:  [id number], DOB:  [birth year], LOS: 0 ADMISSION DATE:  2018/01/22, CONSULTATION DATE:  2018/01/22 REFERRING MD:  Dr. 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  Reported 5 minutes before intervention and 10-15 of CPR before ROSC.    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    At baseline, the patient lives at a group home.  His parents are his guardians.  He is able to communicate his needs with words, sign language and pointing.  He was reportedly on a transport bus for an outing this am.  He was attempting to get off the [place] and choked on a muffin.  EMS was activated.  On arrival, he was pulseless and apneic.  Staff reported ~5 minutes before intervention and 10-15 of CPR before ROSC. Lucas device utilized.   Large amounts of food were suctioned from the airway.  CXR on arrival showed right mainstem intubation & concern for RUL infiltrate.  Initial labs- Na 139, K 3.9, Cl 106, glucose 257, BUN 16, Cr 1.30, lactate 4.04, troponin 0.0, Hgb 13.6.  The patient was started on fentanyl gtt.    PCCM called for ICU admission.    Past Medical History  Down's Syndrome  Hypothyroidism  DM  Aspiration Risk  Constipation  Behavioral issues - "bad day he will refuse certain activities" OCD Astigmatism / Nystagmus  Hearing loss - uses sign language OSA  Pancytopenia  Incontinence  Significant Hospital Events   11/26 Admit after cardiac arrest in setting of suspected aspiration event  Consults:  PCCM 11/26  Procedures:  ETT 11/26 >>   Significant Diagnostic Tests:  CT Head 11/26 >>  Micro Data:  Tracheal Aspirate 11/26 >> BCx2 11/26 >>   Antimicrobials:  Unasyn 11/26 >>    Interim history/subjective:  As above.    Objective   Blood pressure 91/61, pulse 66, resp. rate 15, height 4\' 6"  (1.372 m), SpO2 100 %.    Vent Mode: PRVC FiO2 (%):  [100 %] 100 % Set Rate:  [14 bmp] 14 bmp Vt Set:  [500 mL] 500 mL PEEP:  [5 cmH20] 5 cmH20   Intake/Output Summary (Last 24 hours) at 22-Jan-2018 1102 Last data filed at 2018-01-22 1056 Gross per 24 hour  Intake -  Output 100 ml  Net -100 ml   There were no vitals filed for this visit.  Examination: General: adult male with Down's syndrome on vent  HEENT: MM pink/moist, ETT with bloody secretions in tubing, pupils 58mm/reactive, disconjugate gaze  Neuro: no response to verbal stimuli, intermittent myoclonic jerking, sucking reflex noted, decerebrate posturing  CV: s1s2 rrr, no m/r/g PULM: even/non-labored, lungs bilaterally coarse  WU:JWJX, non-tender, bsx4 active  Extremities: warm/dry, no edema  Skin: no rashes or lesions  Resolved Hospital Problem list     Assessment & Plan:   Respiratory leading to Cardiac Arrest  -suspected aspiration event  P: Admit to ICU  Not a candidate for hypothermia protocol  Goal normothermia / avoid fever  Assess EKG DNR in the event of arrest  Continue supportive efforts for now, await neuro exam    Suspected Aspiration  P: Unasyn IV    Acute Hypoxic Respiratory Failure  -in setting of suspected aspiration, cardiac arrest,  suspected flail chest on exam P: PRVC 8cc/kg  Wean PEEP/FiO2 for sats > 90% Follow intermittent CXR for development of infiltrate Empiric abx as above  Sputum culture  Likely will be difficult to wean due to concern for flail chest injury     Acute Encephalopathy / Concern for Myoclonus  -suspected anoxic/hypoxic  P: Assess CT head  May need Neurology evaluation  Assess EEG Follow serial neuro exams   Aspiration Risk  -baseline hx of aspiration risk  P: Aspiration precautions Will need SLP evaluation if liberated from mechanical ventilation    Hx HTN  -baseline  amlodipine 2.5mg  QD P: Hold home antihypertensives   DM  P: SSI, sensitive scale  Hold home metformin    Hypothyroidism  P: Continue home synthroid, start 11/27   Baseline Disordered Behavior  -will intermittently refuse activity or withdraw from activities  P: Hold home temazepam, risperdal for now    Best practice:  Diet: NPO  Pain/Anxiety/Delirium protocol (if indicated): Fentanyl IV, PRN versed  VAP protocol (if indicated): Ordered  DVT prophylaxis: Heparin  GI prophylaxis: Pepcid  Glucose control: SSI  Mobility: As tolerated  Code Status: Limited code  Family Communication: Brother Rosanne Ashing(Jim) and Aunt's updated at bedside.  Disposition: ICU   Labs   CBC: Recent Labs  Lab 12/30/2017 1028  HGB 13.6  HCT 40.0    Basic Metabolic Panel: Recent Labs  Lab 12/31/2017 1012 12/10/2017 1028  NA 138 139  K 3.9 3.9  CL 109 106  CO2 19*  --   GLUCOSE 262* 257*  BUN 15 16  CREATININE 1.45* 1.30*  CALCIUM 8.3*  --    GFR: CrCl cannot be calculated (Unknown ideal weight.). Recent Labs  Lab 12/15/2017 1029  LATICACIDVEN 4.04*    Liver Function Tests: No results for input(s): AST, ALT, ALKPHOS, BILITOT, PROT, ALBUMIN in the last 168 hours. No results for input(s): LIPASE, AMYLASE in the last 168 hours. No results for input(s): AMMONIA in the last 168 hours.  ABG    Component Value Date/Time   TCO2 22 12/12/2017 1028     Coagulation Profile: Recent Labs  Lab 12/26/2017 1012  INR 1.26    Cardiac Enzymes: No results for input(s): CKTOTAL, CKMB, CKMBINDEX, TROPONINI in the last 168 hours.  HbA1C: No results found for: HGBA1C  CBG: No results for input(s): GLUCAP in the last 168 hours.  Review of Systems:   Unable to complete as patient is on mechanical ventilation.  Past Medical History  He,  has a past medical history of Astigmatism, Diabetes mellitus without complication (HCC), Hypertension, Hypothyroidism, and Sleep apnea.   Surgical History     History reviewed. No pertinent surgical history.   Social History   reports that he has never smoked. He has never used smokeless tobacco. He reports that he does not drink alcohol or use drugs.   Family History   His family history is not on file.   Allergies No Known Allergies   Home Medications  Prior to Admission medications   Not on File     Critical care time: 40 minutes      Canary BrimBrandi Ollis, NP-C [facility] Pgr: [phone] or if no answer [phone] 01/01/2018, 11:02 AM  Attending Note:  47 year old male with down's who lives in a group home who suffered an aspiration event resulting in cardiac arrest with 25 minutes downtime 5 of which were without CPR.  The patient achieved ROSC and [facility]  [facility].  On exam, the patient's chest is flail, caving in with inspiration and has significant myoclonus.  I reviewed CXR myself, ETT is too low with infiltrate noted.  I had an extensive discussion with the patient's brother who is the only capable family member at this time to make decision and described the entire case and concern for extensive brain damage and flail chest.  After discussion, decision was made to proceed with LCB with no further CPR/cardioversion but will re-evaluate as the neuro condition declares itself.  Will treat with unasyn for aspiration pneumonia.  Pan culture.  No cooling given infection, downtime, PEA and myoclonus already being present.  PCCM will admit.  The patient is critically ill with multiple organ systems failure and requires high complexity decision making for assessment and support, frequent evaluation and titration of therapies, application of advanced monitoring technologies and extensive interpretation of multiple databases.   Critical Care Time devoted to patient care services described in this note is  40  Minutes. This time reflects time of care of this signee Dr Koren Bound. This critical care  time does not reflect procedure time, or teaching time or supervisory time of PA/NP/Med student/Med Resident etc but could involve care discussion time.  Alyson Reedy, M.D. [facility] Pulmonary/Critical Care Medicine. Pager: [phone]. After hours pager: [phone].

## 2018-01-04 NOTE — Progress Notes (Signed)
Pt jerking at this, MD paged. MD at bedside MD adding levo low dose so RN can increase fent gtt and versed pushes, RN will continue to monitor.

## 2018-01-04 NOTE — ED Notes (Signed)
O2 concentration increased to 100 on vent, RT to accompany pt to CT and then to [facility]  floor

## 2018-01-04 NOTE — ED Provider Notes (Signed)
[facility] EMERGENCY DEPARTMENT Provider Note   CSN: [id number] Arrival date & time: 2018/01/12  1005     History   Chief Complaint Chief Complaint  Patient presents with  . Cardiac Arrest    HPI Justin Frey is a 47 y.o. male.  HPI Patient was getting off a [employer]ena[employer] Niece and choked on a muffin, he is from a group home, upon ems arrival patient was pulseless and apneic  Approx. Down time of 5 mins. Ems states they did CPR for approx. 10-15 mins. Patient was given epip x 2 intubated with 7.0 tube. Per ems states they suctioned large amt of food from airway. Return of ROSC . Upon arrival patient with pulse rate 75 sinus,  Past Medical History:  Diagnosis Date  . Astigmatism   . Diabetes mellitus without complication (HCC)   . Hypertension   . Hypothyroidism   . Sleep apnea     Patient Active Problem List   Diagnosis Date Noted  . Cardiac arrest (HCC) Jan 12, 2018    History reviewed. No pertinent surgical history.      Home Medications    Prior to Admission medications   Not on File    Family History No family history on file.  Social History Social History   Tobacco Use  . Smoking status: Never Smoker  . Smokeless tobacco: Never Used  Substance Use Topics  . Alcohol use: Never    Frequency: Never  . Drug use: Never     Allergies   Patient has no known allergies.   Review of Systems Review of Systems Level 5 caveat cannot obtain review of systems due to patient condition.  Physical Exam Updated Vital Signs BP 111/73   Pulse 69   Resp 19   Ht 4\' 6"  (1.372 m)   SpO2 100%   Physical Exam  Constitutional:  Patient is intubated on arrival.  Unresponsive.  HENT:  Head: Normocephalic and atraumatic.  Patient has flocculent cranberry material in the mouth.  The ET tube is grossly clear.  Eyes:  Pupils are about 3 to 4 mm and symmetric but sluggish response to light.  Cardiovascular:  Distant heart sounds, rate is regular.  Patient  has palpable femoral pulses.  Pulmonary/Chest:  Breath sounds are coarse.  Patient is being bagged and transition to ventilator.  Breath sounds are bilateral.  Abdominal: Soft. He exhibits no distension.  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  No gross extremity deformities or edema.  Neurological:  Patient is unresponsive on the ventilator.  No pain response.  Skin: Skin is warm and dry.    ED Treatments / Results  Labs (all labs ordered are listed, but only abnormal results are displayed) Labs Reviewed  BASIC METABOLIC PANEL - Abnormal; Notable for the following components:      Result Value   CO2 19 (*)    Glucose, Bld 262 (*)    Creatinine, Ser 1.45 (*)    Calcium 8.3 (*)    GFR calc non Af Amer 57 (*)    All other components within normal limits  CBC WITH DIFFERENTIAL/PLATELET - Abnormal; Notable for the following components:   WBC 10.7 (*)    Abs Immature Granulocytes 0.78 (*)    All other components within normal limits  PROTIME-INR - Abnormal; Notable for the following components:   Prothrombin Time 15.7 (*)    All other components within normal limits  I-STAT CG4 LACTIC ACID, ED - Abnormal; Notable  for the following components:   Lactic Acid, Venous 4.04 (*)    All other components within normal limits  I-STAT CHEM 8, ED - Abnormal; Notable for the following components:   Creatinine, Ser 1.30 (*)    Glucose, Bld 257 (*)    All other components within normal limits  I-STAT ARTERIAL BLOOD GAS, ED - Abnormal; Notable for the following components:   pH, Arterial 7.260 (*)    pCO2 arterial 48.7 (*)    Acid-base deficit 5.0 (*)    All other components within normal limits  CULTURE, RESPIRATORY  CULTURE, BLOOD (ROUTINE X 2)  CULTURE, BLOOD (ROUTINE X 2)  HIV ANTIBODY (ROUTINE TESTING W REFLEX)  I-STAT TROPONIN, ED  I-STAT CG4 LACTIC ACID, ED    EKG None  Radiology Dg Chest Port 1 View  Result  Date: 12/16/2017 CLINICAL DATA:  Respiratory arrest EXAM: PORTABLE CHEST 1 VIEW COMPARISON:  None. FINDINGS: Endotracheal tube tip is in the right main bronchus. Nasogastric tube tip and side port are in the stomach. There is patchy airspace opacity with volume loss in the right upper lobe. There is atelectasis in the right base. Lungs elsewhere are clear. Heart size and pulmonary vascularity are normal. No adenopathy. No pneumothorax. No bone lesions. IMPRESSION: Endotracheal tube tip is in the right main bronchus. Advise withdrawing endotracheal tube approximately 5.5-6 cm. No pneumothorax. Patchy infiltrate right upper lobe. Question pneumonia or aspiration. Mild right base atelectasis noted. Heart size normal. Critical Value/emergent results were called by telephone at the time of interpretation on 12/22/2017 at 10:40 am to Dr. Charm BargesButler, ED physician , who verbally acknowledged these results. Electronically Signed   By: Bretta BangWilliam  Woodruff III M.D.   On: 01/01/2018 10:41    Procedures Procedures (including critical care time) CRITICAL CARE Performed by: Arby BarretteMarcy Tomasita Beevers   Total critical care time: 20 minutes  Critical care time was exclusive of separately billable procedures and treating other patients.  Critical care was necessary to treat or prevent imminent or life-threatening deterioration.  Critical care was time spent personally by me on the following activities: development of treatment plan with patient and/or surrogate as well as nursing, discussions with consultants, evaluation of patient's response to treatment, examination of patient, obtaining history from patient or surrogate, ordering and performing treatments and interventions, ordering and review of laboratory studies, ordering and review of radiographic studies, pulse oximetry and re-evaluation of patient's condition. Medications Ordered in ED Medications  fentaNYL 2500mcg in NS 250mL (3710mcg/ml) infusion-PREMIX (25 mcg/hr  Intravenous New Bag/Given 12/10/2017 1056)  midazolam (VERSED) injection 1-2 mg (1 mg Intravenous Given 12/26/2017 1101)  heparin injection 5,000 Units (has no administration in time range)  famotidine (PEPCID) 40 MG/5ML suspension 20 mg (has no administration in time range)  lactated ringers infusion (has no administration in time range)  acetaminophen (TYLENOL) tablet 650 mg (has no administration in time range)  ondansetron (ZOFRAN) injection 4 mg (has no administration in time range)  ampicillin-sulbactam (UNASYN) 1.5 g in sodium chloride 0.9 % 100 mL IVPB (1.5 g Intravenous New Bag/Given 12/13/2017 1202)  insulin aspart (novoLOG) injection 0-9 Units (has no administration in time range)  levothyroxine (SYNTHROID, LEVOTHROID) tablet 88 mcg (has no administration in time range)     Initial Impression / Assessment and Plan / ED Course  I have reviewed the triage vital signs and the nursing notes.  Pertinent labs & imaging results that were available during my care of the patient were reviewed by me and considered in my medical  decision making (see chart for details).    Patient arrived by EMS with a successful resuscitation after a choking episode.  Patient is completely unresponsive and has diminished pupillary response.  Pupils are however symmetric.  Epinephrine drip that had been initiated was discontinued and patient's blood pressure remained stable.  Will initiate cooling protocol for postarrest.  Intensivist consulted for admission.  Final Clinical Impressions(s) / ED Diagnoses   Final diagnoses:  Aspiration of food, initial encounter  Acute respiratory failure with hypoxia [facility])    ED Discharge Orders    None       Arby Barrette, MD 2018-01-19 1204

## 2018-01-04 NOTE — Progress Notes (Signed)
Patient transported to CT and 2H22.

## 2018-01-04 NOTE — ED Notes (Signed)
RT at bedside collecting arterial blood gas

## 2018-01-04 NOTE — ED Triage Notes (Signed)
Patient was getting off a [employer]enaida Niecevan and choked on a muffin, he is from a group home, upon ems arrival patient was pulseless and apneic  Approx. Down time of 5 mins. Ems states they did CPR for approx. 10-15 mins. Patient was given epip x 2 intubated with 7.0 tube. Per ems states they suctioned large amt of food from airway. Return of ROSC . Upon arrival patient with pulse rate 75 sinus,

## 2018-01-04 NOTE — Procedures (Addendum)
History: 47 year old male with anoxic brain injury  Sedation: Versed, fentanyl  Technique: This is a 21 channel routine scalp EEG performed at the bedside with bipolar and monopolar montages arranged in accordance to the international 10/20 system of electrode placement. One channel was dedicated to EKG recording.    Background: The background is suppressed with occasional epileptiform bursts lasting less than one second associated with clinical generalized myoclonus on video.  Photic stimulation: Physiologic driving is not performed  EEG Abnormalities: 1) burst suppression with myoclonus associated with epileptiform bursts  Clinical Interpretation: This  EEG is consistent with a profound cerebral dysfunction.  In the setting of anoxic brain injury, this study is highly correlated with a poor prognosis.  Justin SlotMcNeill Erice Ahles, MD [facility] [phone]  If 7pm- 7am, please page neurology on call as listed in AMION.

## 2018-01-05 ENCOUNTER — Inpatient Hospital Stay (HOSPITAL_COMMUNITY): Payer: Medicare Other

## 2018-01-05 DIAGNOSIS — Z515 Encounter for palliative care: Secondary | ICD-10-CM

## 2018-01-05 DIAGNOSIS — R579 Shock, unspecified: Secondary | ICD-10-CM

## 2018-01-05 DIAGNOSIS — Z7189 Other specified counseling: Secondary | ICD-10-CM

## 2018-01-05 LAB — CBC
HCT: 34.8 % — ABNORMAL LOW (ref 39.0–52.0)
Hemoglobin: 11.6 g/dL — ABNORMAL LOW (ref 13.0–17.0)
MCH: 31.1 pg (ref 26.0–34.0)
MCHC: 33.3 g/dL (ref 30.0–36.0)
MCV: 93.3 fL (ref 80.0–100.0)
NRBC: 0 % (ref 0.0–0.2)
PLATELETS: 70 10*3/uL — AB (ref 150–400)
RBC: 3.73 MIL/uL — AB (ref 4.22–5.81)
RDW: 13.6 % (ref 11.5–15.5)
WBC: 11.5 10*3/uL — ABNORMAL HIGH (ref 4.0–10.5)

## 2018-01-05 LAB — BASIC METABOLIC PANEL
ANION GAP: 11 (ref 5–15)
BUN: 29 mg/dL — ABNORMAL HIGH (ref 6–20)
CO2: 17 mmol/L — ABNORMAL LOW (ref 22–32)
Calcium: 8.3 mg/dL — ABNORMAL LOW (ref 8.9–10.3)
Chloride: 114 mmol/L — ABNORMAL HIGH (ref 98–111)
Creatinine, Ser: 2.06 mg/dL — ABNORMAL HIGH (ref 0.61–1.24)
GFR, EST AFRICAN AMERICAN: 43 mL/min — AB (ref >60)
GFR, EST NON AFRICAN AMERICAN: 38 mL/min — AB (ref >60)
Glucose, Bld: 127 mg/dL — ABNORMAL HIGH (ref 70–99)
POTASSIUM: 4.6 mmol/L (ref 3.5–5.1)
SODIUM: 142 mmol/L (ref 135–145)

## 2018-01-05 LAB — GLUCOSE, CAPILLARY
GLUCOSE-CAPILLARY: 108 mg/dL — AB (ref 70–99)
GLUCOSE-CAPILLARY: 117 mg/dL — AB (ref 70–99)
GLUCOSE-CAPILLARY: 137 mg/dL — AB (ref 70–99)
Glucose-Capillary: 138 mg/dL — ABNORMAL HIGH (ref 70–99)
Glucose-Capillary: 169 mg/dL — ABNORMAL HIGH (ref 70–99)

## 2018-01-05 LAB — MAGNESIUM: MAGNESIUM: 1.2 mg/dL — AB (ref 1.7–2.4)

## 2018-01-05 LAB — PHOSPHORUS: Phosphorus: 5.2 mg/dL — ABNORMAL HIGH (ref 2.5–4.6)

## 2018-01-05 MED ORDER — LORAZEPAM 2 MG/ML IJ SOLN
0.5000 mg/h | INTRAVENOUS | Status: DC
  Administered 2018-01-05: 0.5 mg/h via INTRAVENOUS
  Filled 2018-01-05: qty 25

## 2018-01-05 MED ORDER — SODIUM CHLORIDE 0.9 % IV SOLN
0.5000 mg/h | INTRAVENOUS | Status: AC
  Administered 2018-01-05: 2 mg/h via INTRAVENOUS
  Filled 2018-01-05: qty 10

## 2018-01-06 LAB — CULTURE, RESPIRATORY W GRAM STAIN: Culture: NORMAL

## 2018-01-09 LAB — CULTURE, BLOOD (ROUTINE X 2)
CULTURE: NO GROWTH
Culture: NO GROWTH

## 2018-01-09 NOTE — Progress Notes (Signed)
Approximately 50 cc's of fentanyl and 10 cc's of ativan wasted, witnessed by Modena JanskyKevin Dennis

## 2018-01-09 NOTE — Progress Notes (Signed)
Nutrition Brief Note  Chart reviewed. Pt triggered for assessment due to vent status.  Pt NPO; noted plan for no escalation of care, DNR status. Plan for terminal, one way extubation once family arrives tomorrow with emphasis on comfort.  No nutrition interventions warranted at this time.  Please re-consult as needed.   Romelle Starcherate Evalise Abruzzese MS, RD, LDN, CNSC [phone] Pager  [phone] Weekend/On-Call Pager

## 2018-01-09 NOTE — Progress Notes (Signed)
Called by RN, family is ready for conversation about withdrawal.  All questions are answered.  They are most concerned about patient's suffered at this point.  I spoke neurology, they believe the EEG is that very poor prognosis.  After discussion with brother and sister decision was made to proceed with full comfort care, ativan, fentanyl and extubation.  Will increase fentanyl, d/c levophed and extubate.  Patient was extubated and family brought back for him to expire shortly thereafter with the family bedside.  The patient is critically ill with multiple organ systems failure and requires high complexity decision making for assessment and support, frequent evaluation and titration of therapies, application of advanced monitoring technologies and extensive interpretation of multiple databases.   Critical Care Time devoted to patient care services described in this note is  45  Minutes. This time reflects time of care of this signee Dr Koren BoundWesam Indra Wolters. This critical care time does not reflect procedure time, or teaching time or supervisory time of PA/NP/Med student/Med Resident etc but could involve care discussion time.  Alyson ReedyWesam G. Sherilyn Windhorst, M.D. [facility] Pulmonary/Critical Care Medicine. Pager: [phone]. After hours pager: [phone].

## 2018-01-09 NOTE — Progress Notes (Signed)
eLink Physician-Brief Progress Note Patient Name: Justin Frey DOB: 1970-06-05 MRN: [id number]   Date of Service  01/02/2018  HPI/Events of Note  Seizures activity vs Anoxic Myoclonus - EEG Abnormalities: 1) burst suppression with myoclonus associated with epileptiform bursts  eICU Interventions  Will order: 1. Keppra 1 gm IV now (load). 2. Keppra 500 mg IV Q 12 hours (maintain). 3. Versed IV infusion 0.5 - 4.0 mg/hour. Titrate to RASS = 0.      Intervention Category Major Interventions: Seizures - evaluation and management  Sommer,Steven Eugene 01/07/2018, 12:02 AM

## 2018-01-09 NOTE — Progress Notes (Addendum)
NAME:  Justin Frey, MRN:  [id number], DOB:  31-May-1970, LOS: 1 ADMISSION DATE:  01-08-18, CONSULTATION DATE:  2018-01-08 REFERRING MD:  Dr. 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  Reported 5 minutes before intervention and 10-15 of CPR before ROSC.  Concern for myoclonus vs seizure on admit.  Hypotensive requiring vasopressors.   Past Medical History  Down's Syndrome  Hypothyroidism  DM  Aspiration Risk  Constipation  Behavioral issues - "bad day he will refuse certain activities" OCD Astigmatism / Nystagmus  Hearing loss - uses sign language OSA  Pancytopenia  Incontinence  Significant Hospital Events   11/26 Admit after cardiac arrest in setting of suspected aspiration event 11/27 Ongoing myoclonus on exam, vasopressors   Consults:  PCCM 11/26  Procedures:  ETT 11/26 >>   Significant Diagnostic Tests:  CT Head 11/26 >> chronic appearing changes without acute abnormality EEG 11/27 >> burst suppression with myoclonus associated with epileptiform bursts  Micro Data:  Tracheal Aspirate 11/26 >> BCx2 11/26 >>   Antimicrobials:  Unasyn 11/26 >>    Interim history/subjective:  Tmax 100.1.  EEG completed. Family at bedside.  Report pt's sister is on her way in to visit.    Objective   Blood pressure (!) 85/53, pulse 88, temperature 100.1 F (37.8 C), temperature source Axillary, resp. rate (!) 26, height 4\' 6"  (1.372 m), SpO2 99 %.    Vent Mode: PRVC FiO2 (%):  [80 %-100 %] 80 % Set Rate:  [14 bmp-16 bmp] 16 bmp Vt Set:  [470 mL-500 mL] 470 mL PEEP:  [5 cmH20-8 cmH20] 5 cmH20 Plateau Pressure:  [15 cmH20-18 cmH20] 18 cmH20   Intake/Output Summary (Last 24 hours) at 12/26/2017 0852 Last data filed at 12/15/2017 0800 Gross per 24 hour  Intake 2353.74 ml  Output 570 ml  Net 1783.74 ml   There were no vitals filed for  this visit.  Examination: General: adult male on vent in NAD HEENT: MM pink/moist, ETT, Down's features  Neuro: continuous myoclonic activity in face, arms, no response to stimuli CV: s1s2 rrr, no m/r/g PULM: even/non-labored, lungs bilaterally coarse  WU:JWJX, non-tender, bsx4 active  Extremities: warm/dry, no edema  Skin: no rashes or lesions  Resolved Hospital Problem list     Assessment & Plan:   Respiratory leading to Cardiac Arrest  -suspected aspiration event  P: ICU monitoring  Normothermia as goal / avoid fever  DNR in the event of further arrest  Supportive efforts, follow neuro exam   Suspected Aspiration  P: Continue unasyn for aspiration  Follow serial CXR  Acute Hypoxic Respiratory Failure  -in setting of suspected aspiration, cardiac arrest, suspected flail chest on exam P: PRVC 8cc/kg  Wean PEEP / FiO2 for sats >90% Follow CXR  Await cultures  Concern for flail chest from CPR > would be difficult to wean   Will transition to ativan gtt from versed for likely withdrawal of aggressive support  Acute Encephalopathy / Concern for Myoclonus  -suspected anoxic/hypoxic  -CT head negative  -EEG concerning for profound cerebral dysfunction P: Follow serial neuro exams  Continue keppra   May need Neurology evaluation  Assess EEG Follow serial neuro exams  Aspiration Risk  -baseline hx of aspiration risk  P: Aspiration precautions   Hx HTN  -baseline amlodipine 2.5mg  QD P: Hold home medications   DM  P: SSI, sensitive scale  Hold home metformin   Hypothyroidism  P: Synthroid PT   Baseline Disordered Behavior  -will intermittently refuse activity or withdraw from activities  P: Hold home temazepam, risperdal for now   Best practice:  Diet: NPO  Pain/Anxiety/Delirium protocol (if indicated): Fentanyl IV, PRN versed  VAP protocol (if indicated): Ordered  DVT prophylaxis: Heparin  GI prophylaxis: Pepcid  Glucose control: SSI    Mobility: As tolerated  Code Status: Limited code  Family Communication: Aunt's updated at bedside 11/27 am.  They indicate the patients sister is driving here to visit.  They want her to be able to visit / have time with him.  They state that they believe they will want to remove the ventilator once family has had time to visit with him.    Disposition: ICU   Labs   CBC: Recent Labs  Lab Jun 25, 2017 1012 Jun 25, 2017 1028 12/18/2017 0327  WBC 10.7*  --  11.5*  NEUTROABS 6.9  --   --   HGB 13.5 13.6 11.6*  HCT 43.4 40.0 34.8*  MCV 99.3  --  93.3  PLT PLATELET CLUMPS NOTED ON SMEAR, UNABLE TO ESTIMATE  --  70*    Basic Metabolic Panel: Recent Labs  Lab Jun 25, 2017 1012 Jun 25, 2017 1028 12/14/2017 0327  NA 138 139 142  K 3.9 3.9 4.6  CL 109 106 114*  CO2 19*  --  17*  GLUCOSE 262* 257* 127*  BUN 15 16 29*  CREATININE 1.45* 1.30* 2.06*  CALCIUM 8.3*  --  8.3*  MG  --   --  1.2*  PHOS  --   --  5.2*   GFR: CrCl cannot be calculated (Unknown ideal weight.). Recent Labs  Lab Jun 25, 2017 1012 Jun 25, 2017 1029 12/22/2017 0327  WBC 10.7*  --  11.5*  LATICACIDVEN  --  4.04*  --     Liver Function Tests: No results for input(s): AST, ALT, ALKPHOS, BILITOT, PROT, ALBUMIN in the last 168 hours. No results for input(s): LIPASE, AMYLASE in the last 168 hours. No results for input(s): AMMONIA in the last 168 hours.  ABG    Component Value Date/Time   PHART 7.260 (L) 2017-03-11 1141   PCO2ART 48.7 (H) 2017-03-11 1141   PO2ART 105.0 2017-03-11 1141   HCO3 21.9 2017-03-11 1141   TCO2 23 2017-03-11 1141   ACIDBASEDEF 5.0 (H) 2017-03-11 1141   O2SAT 97.0 2017-03-11 1141     Coagulation Profile: Recent Labs  Lab Jun 25, 2017 1012  INR 1.26    Cardiac Enzymes: No results for input(s): CKTOTAL, CKMB, CKMBINDEX, TROPONINI in the last 168 hours.  HbA1C: No results found for: HGBA1C  CBG: Recent Labs  Lab Jun 25, 2017 1626 Jun 25, 2017 2014 Jun 25, 2017 2359 12/14/2017 0412 12/16/2017 0816  GLUCAP  196* 108* 108* 117* 137*     Critical Care Time: 30 minutes     Canary BrimBrandi Ollis, NP-C  Pulmonary & Critical Care Pgr: [phone] or if no answer [phone] 12/24/2017, 8:52 AM  Attending Note:  47 year old male with down's who presents to PCCM after a cardiac arrest.  Patient continues to have myoclonus and remains comatose.  Overnight, used benzos and pressors to address hypotension induced benzo use to suppress myoclonus for the family's sake.  On exam, continues to have intermittent myoclonus.  I reviewed CXR myself, ETT is in a good position.  Discussed with PCCM-NP.  Will continue support for now but no further escalation of care short of pressor use but not adding another agent.  Family is to arrive tomorrow and once here will proceed with termination of care and emphasis more on comfort.  DNR in the meantime.  The patient is critically ill with multiple organ systems failure and requires high complexity decision making for assessment and support, frequent evaluation and titration of therapies, application of advanced monitoring technologies and extensive interpretation of multiple databases.   Critical Care Time devoted to patient care services described in this note is  33  Minutes. This time reflects time of care of this signee Dr Koren Bound. This critical care time does not reflect procedure time, or teaching time or supervisory time of PA/NP/Med student/Med Resident etc but could involve care discussion time.  Alyson Reedy, M.D. [facility] Pulmonary/Critical Care Medicine. Pager: [phone]. After hours pager: [phone].

## 2018-01-09 NOTE — Procedures (Signed)
Extubation Procedure Note  Patient Details:   Name: Justin Frey DOB: Feb 26, 1970 MRN: [id number]   Airway Documentation:    Vent end date: 07/23/2017 Vent end time: 1825   Evaluation  O2 sats: currently acceptable Complications: No apparent complications Patient did tolerate procedure well. Bilateral Breath Sounds: Clear   Pt extubated at this time per MD order for comfort care.   Loyal Jacobsonhompson, Sent[facility]or [facility] 08/27/2017, 6:32 PM

## 2018-01-09 DEATH — deceased

## 2018-01-11 ENCOUNTER — Telehealth: Payer: Self-pay | Admitting: Pulmonary Disease

## 2018-01-11 NOTE — Telephone Encounter (Signed)
Rec'd via [employer], the death certificate for the patient to be signed by Dr. Molli KnockYacoub. I spoke with Marcelino DusterMichelle in medical records, she stated it was ok to interoffice it to her, and her department with get Dr. Molli KnockYacoub to complete. Sent death certificate along with return [employer] envelope to CliveMichelle via [employer] mail today -pr

## 2018-01-12 ENCOUNTER — Encounter: Payer: Self-pay | Admitting: Hematology and Oncology

## 2018-01-12 ENCOUNTER — Telehealth: Payer: Self-pay

## 2018-01-12 NOTE — Telephone Encounter (Signed)
On 01/12/18 I [facility] ([facility] DC is for burial. Patient is a patient of Doctor Molli KnockYacoub.  DC will be taken to [facility] for signature.

## 2018-01-19 ENCOUNTER — Telehealth: Payer: Self-pay | Admitting: Pulmonary Disease

## 2018-01-19 NOTE — Telephone Encounter (Signed)
01/19/18 Received Signed Original DC by Dr. Molli KnockYacoub and [facility] to [facility] and [facility]

## 2018-02-09 NOTE — Death Summary Note (Signed)
DEATH SUMMARY   Patient Details  Name: MCCALL LOMAX MRN: [id number] DOB: 01-Jul-1970  Admission/Discharge Information   Admit Date:  January 22, 2018  Date of Death: Date of Death: Jan 23, 2018  Time of Death: Time of Death: 1840  Length of Stay: 1  Referring Physician: Johney Maine, MD   Reason(s) for Hospitalization  Cardiac arrest  Diagnoses  Preliminary cause of death:   Pulseless electrical activity cardiac arrest  Secondary Diagnoses (including complications and co-morbidities):  Active Problems:   Cardiac arrest [facility]) Acute respiratory failure Aspiration into the airway Myoclonus Anoxic encephalopathy  Brief Hospital Course (including significant findings, care, treatment, and services provided and events leading to death)  48 y/o M with Down's Syndrome admitted 23-Jan-2023 after suffering a cardiac arrest in the setting of suspected aspiration.  Reported 5 minutes before intervention and 10-15 of CPR before ROSC.  Concern for myoclonus vs seizure on admit.  Hypotensive requiring vasopressors.   Patient had an EEG consistent with anoxic encephalopathy and that information was relayed to the family.  Called by RN, family is ready for conversation about withdrawal.  All questions are answered.  They are most concerned about patient's suffered at this point.  I spoke neurology, they believe the EEG is that very poor prognosis.  After discussion with brother and sister decision was made to proceed with full comfort care, ativan, fentanyl and extubation.  Will increase fentanyl, d/c levophed and extubate.  Patient was extubated and family brought back for him to expire shortly thereafter with the family bedside.  Pertinent Labs and Studies  Significant Diagnostic Studies Ct Head Wo Contrast  Result Date: 22-Jan-2018 CLINICAL DATA:  Recent choking episode with CPR and return of ROSC EXAM: CT HEAD WITHOUT CONTRAST TECHNIQUE: Contiguous axial images were obtained from the base  of the skull through the vertex without intravenous contrast. COMPARISON:  None. FINDINGS: Brain: Mild prominence of the CSF spaces in the posterior cranial fossa are seen. No findings to suggest acute hemorrhage, acute infarction or space-occupying mass lesion is seen. Vascular: No hyperdense vessel or unexpected calcification. Skull: Normal. Negative for fracture or focal lesion. Sinuses/Orbits: Some mucosal thickening is noted within the ethmoid and sphenoid sinuses likely of a chronic nature. Postsurgical changes in the maxillary antra are noted. Other: Air is noted in subcutaneous venous branches likely related to IV initiation IMPRESSION: Chronic appearing changes without acute intracranial abnormality. Electronically Signed   By: Alcide Clever M.D.   On: 2018/01/22 13:06   Dg Chest Port 1 View  Result Date: 01-23-18 CLINICAL DATA:  Follow-up endotracheal tube EXAM: PORTABLE CHEST 1 VIEW COMPARISON:  01-22-2018 FINDINGS: Endotracheal tube and nasogastric catheter are again noted in satisfactory position. Cardiac shadow is stable. Increasing left basilar infiltrate is seen when compared with the prior exam. No pneumothorax is noted. No effusion is seen. IMPRESSION: Increasing right basilar infiltrate. Electronically Signed   By: Alcide Clever M.D.   On: 01-23-18 09:04   Dg Chest Port 1 View  Result Date: Jan 22, 2018 CLINICAL DATA:  Respiratory arrest EXAM: PORTABLE CHEST 1 VIEW COMPARISON:  None. FINDINGS: Endotracheal tube tip is in the right main bronchus. Nasogastric tube tip and side port are in the stomach. There is patchy airspace opacity with volume loss in the right upper lobe. There is atelectasis in the right base. Lungs elsewhere are clear. Heart size and pulmonary vascularity are normal. No adenopathy. No pneumothorax. No bone lesions. IMPRESSION: Endotracheal tube tip is in the right main bronchus. Advise withdrawing endotracheal tube  approximately 5.5-6 cm. No pneumothorax. Patchy  infiltrate right upper lobe. Question pneumonia or aspiration. Mild right base atelectasis noted. Heart size normal. Critical Value/emergent results were called by telephone at the time of interpretation on 12/14/2017 at 10:40 am to Dr. Charm Barges, ED physician , who verbally acknowledged these results. Electronically Signed   By: Bretta Bang III M.D.   On: 12/13/2017 10:41    Microbiology Recent Results (from the past 240 hour(s))  Culture, blood (routine x 2)     Status: None   Collection Time: 12/11/2017 11:40 AM  Result Value Ref Range Status   Specimen Description BLOOD LEFT HAND  Final   Special Requests   Final    BOTTLES DRAWN AEROBIC AND ANAEROBIC Blood Culture results may not be optimal due to an inadequate volume of blood received in culture bottles   Culture   Final    NO GROWTH 5 DAYS Performed at [facility], [address] [place], [place] [address]    Report Status 01/09/2018 FINAL  Final  Culture, blood (routine x 2)     Status: None   Collection Time: 01/08/2018 11:45 AM  Result Value Ref Range Status   Specimen Description BLOOD RIGHT HAND  Final   Special Requests   Final    BOTTLES DRAWN AEROBIC AND ANAEROBIC Blood Culture results may not be optimal due to an inadequate volume of blood received in culture bottles   Culture   Final    NO GROWTH 5 DAYS Performed at [facility], [address], [place], [place] [address]    Report Status 01/09/2018 FINAL  Final  Culture, respiratory (tracheal aspirate)     Status: None   Collection Time: 12/26/2017  1:19 PM  Result Value Ref Range Status   Specimen Description TRACHEAL ASPIRATE  Final   Special Requests NONE  Final   Gram Stain   Final    RARE WBC PRESENT,BOTH PMN AND MONONUCLEAR MODERATE GRAM POSITIVE COCCI IN PAIRS IN CHAINS RARE GRAM NEGATIVE RODS FEW GRAM NEGATIVE COCCOBACILLI RARE SQUAMOUS EPITHELIAL CELLS PRESENT    Culture   Final    MODERATE Consistent with normal respiratory  flora. Performed at [facility], [address], [place], [place] [address]    Report Status 01/06/2018 FINAL  Final  MRSA PCR Screening     Status: None   Collection Time: 12/20/2017  1:19 PM  Result Value Ref Range Status   MRSA by PCR NEGATIVE NEGATIVE Final    Comment:        The GeneXpert MRSA Assay (FDA approved for NASAL specimens only), is one component of a comprehensive MRSA colonization surveillance program. It is not intended to diagnose MRSA infection nor to guide or monitor treatment for MRSA infections. Performed at [facility], [address], [place], [place] [address]     Lab Basic Metabolic Panel: Recent Labs  Lab 28-Jan-2018 0327  NA 142  K 4.6  CL 114*  CO2 17*  GLUCOSE 127*  BUN 29*  CREATININE 2.06*  CALCIUM 8.3*  MG 1.2*  PHOS 5.2*   Liver Function Tests: No results for input(s): AST, ALT, ALKPHOS, BILITOT, PROT, ALBUMIN in the last 168 hours. No results for input(s): LIPASE, AMYLASE in the last 168 hours. No results for input(s): AMMONIA in the last 168 hours. CBC: Recent Labs  Lab Jan 28, 2018 0327  WBC 11.5*  HGB 11.6*  HCT 34.8*  MCV 93.3  PLT 70*   Cardiac  Enzymes: No results for input(s): CKTOTAL, CKMB, CKMBINDEX, TROPONINI in the last 168 hours. Sepsis Labs: Recent Labs  Lab 12/27/2017 0327  WBC 11.5*    Procedures/Operations     Manav Pierotti 01/11/2018, 4:36 PM

## 2018-03-01 ENCOUNTER — Ambulatory Visit: Payer: Self-pay | Admitting: Hematology

## 2018-03-01 ENCOUNTER — Other Ambulatory Visit: Payer: Self-pay

## 2020-06-17 IMAGING — CT CT HEAD W/O CM
4 series · 16 of 47 positions shown, 18 images · non-contrast
Comparison: None.

CLINICAL DATA: Recent choking episode with CPR and return of ROSC

EXAM:
CT HEAD WITHOUT CONTRAST
TECHNIQUE: Contiguous axial images were obtained from the base of the skull
through the vertex without intravenous contrast.

[Series 3: head wo · axial · 0.42mm/px · z∈[-131,-16]mm · 7 of 31 slices shown, 9 images]
[im 4/31  brain]
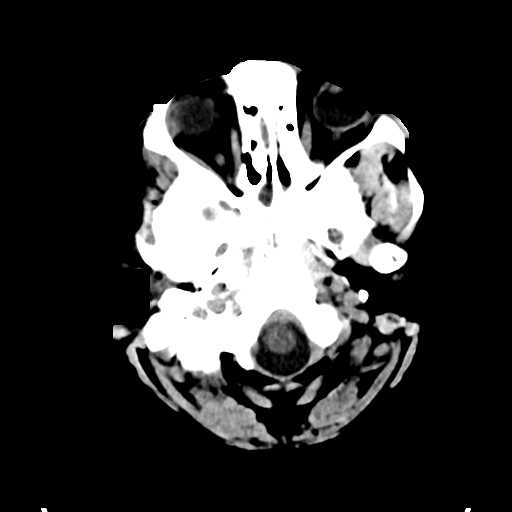
[im 4/31  bone]
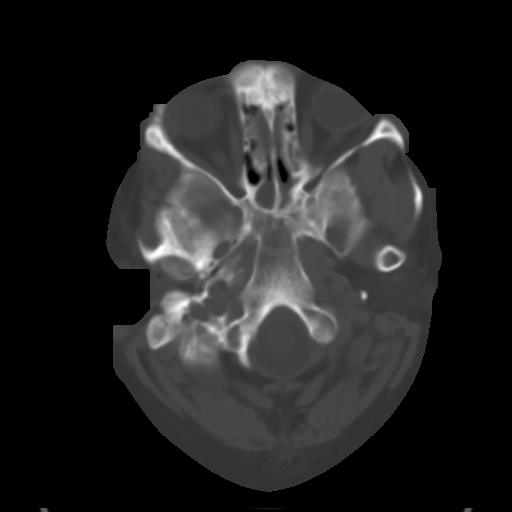
[im 8/31  brain]
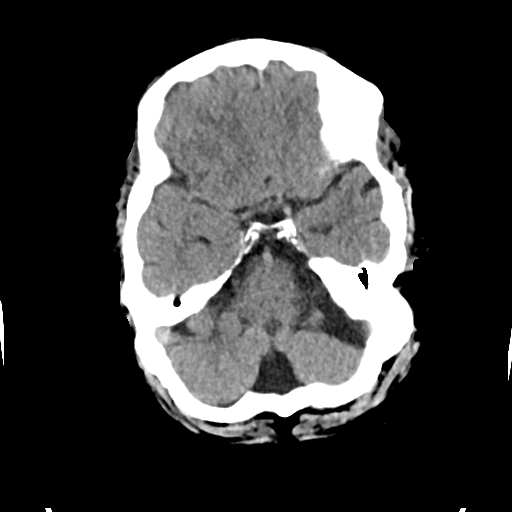
[im 12/31  brain]
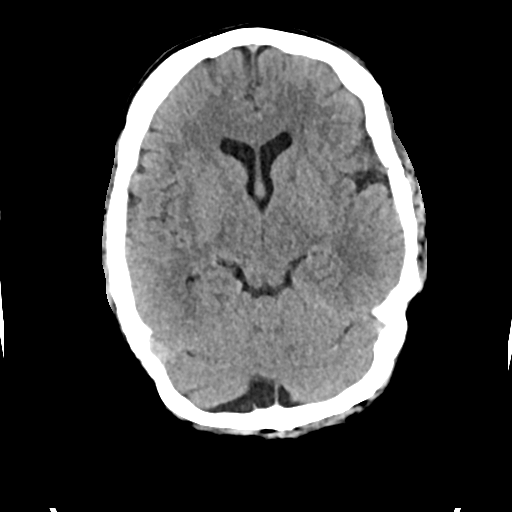
[im 16/31  brain]
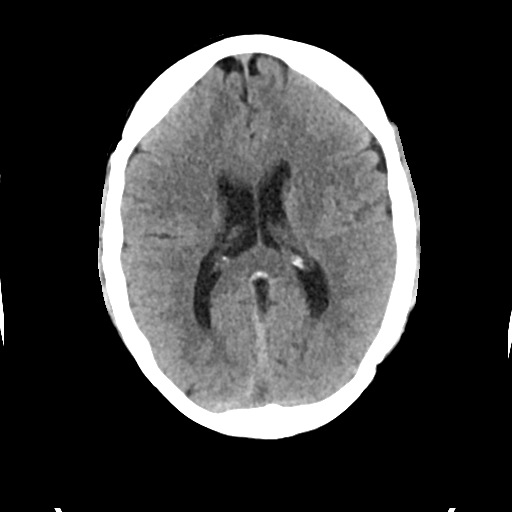
[im 19/31  brain]
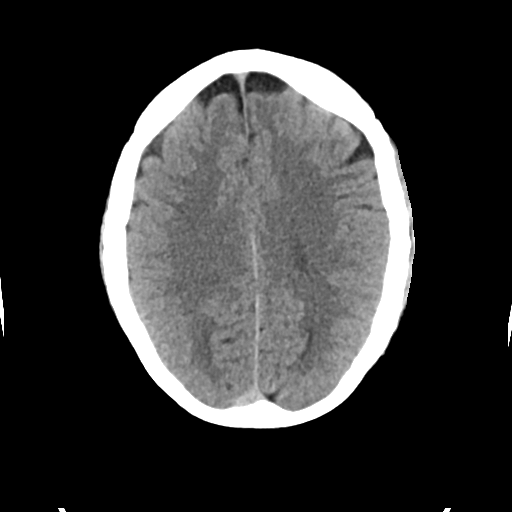
[im 19/31  bone]
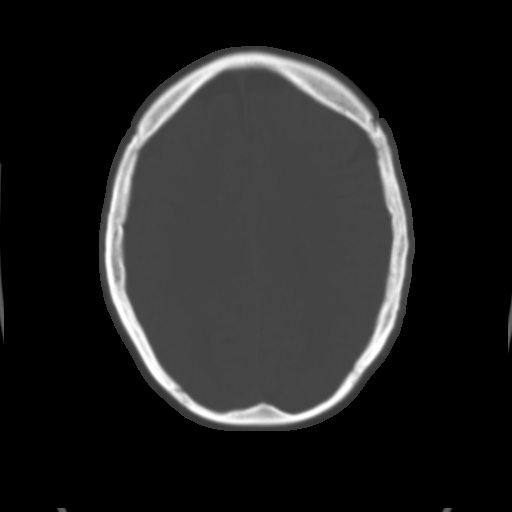
[im 23/31  brain]
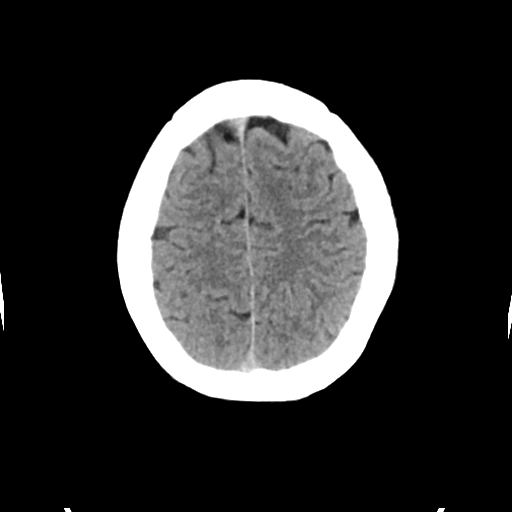
[im 27/31  brain]
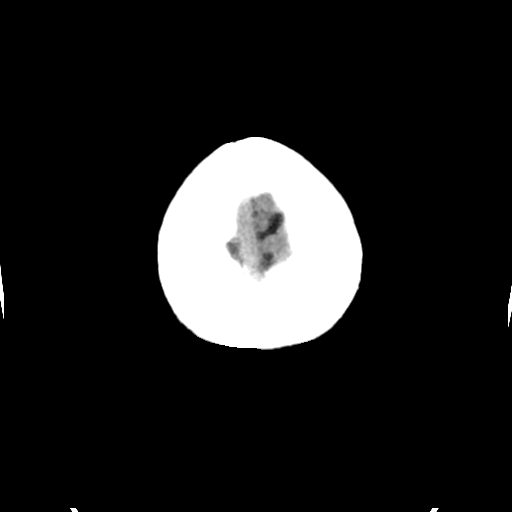

[Series 4: head bone · axial · 0.42mm/px · z∈[-132,-102]mm · 3 of 77 slices shown]
[im 8/77  bone]
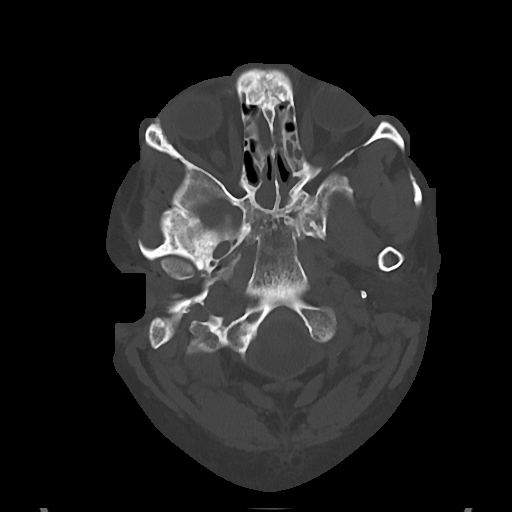
[im 16/77  bone]
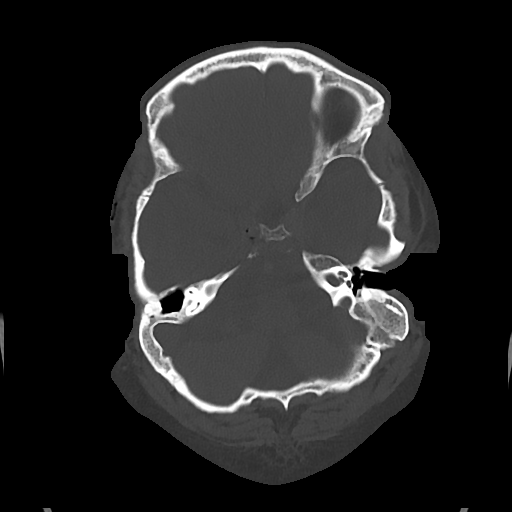
[im 23/77  bone]
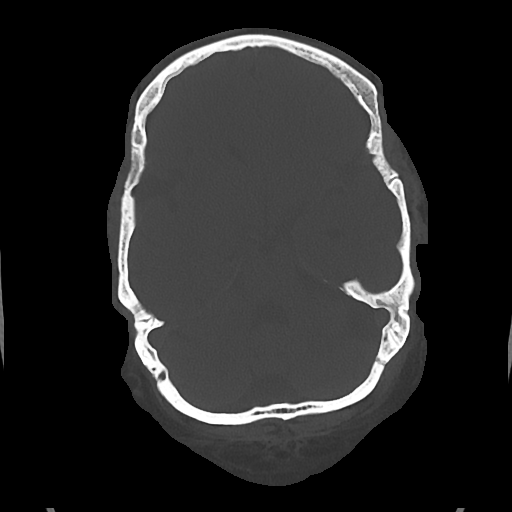

[Series 5: cor soft · coronal · 0.33mm/px · 3 of 68 slices shown]
[im 23/68  brain]
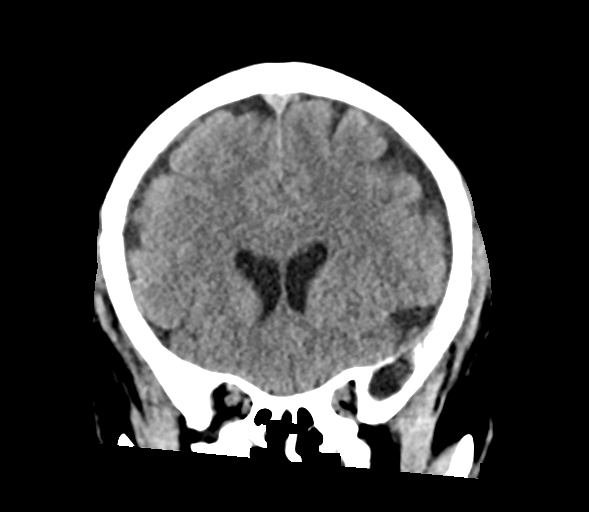
[im 30/68  brain]
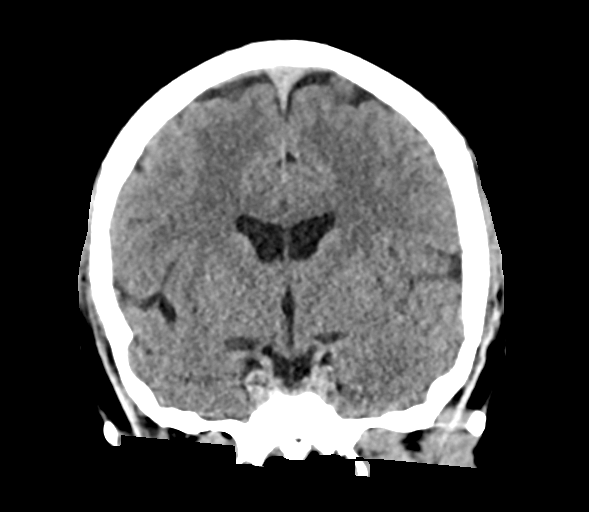
[im 38/68  brain]
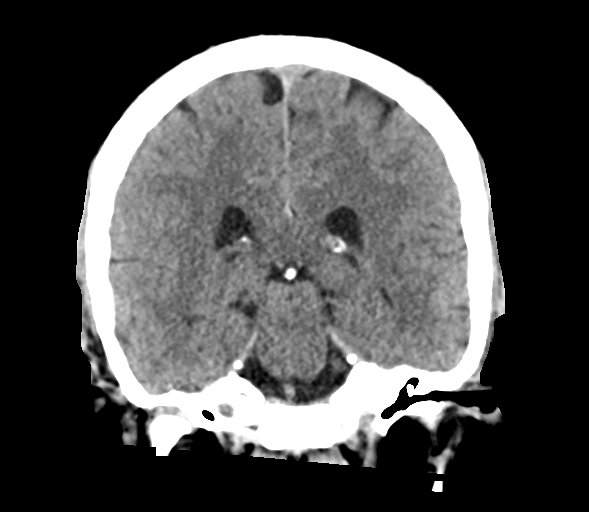

[Series 6: sag soft · sagittal · 0.33mm/px · 3 of 61 slices shown]
[im 22/61  brain]
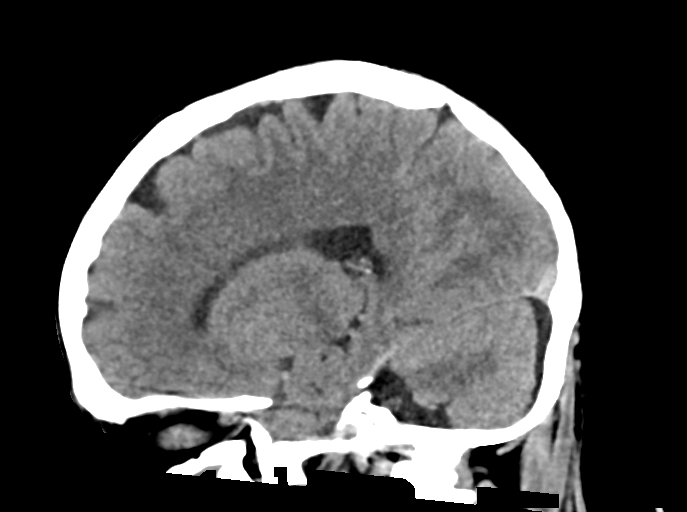
[im 31/61  brain]
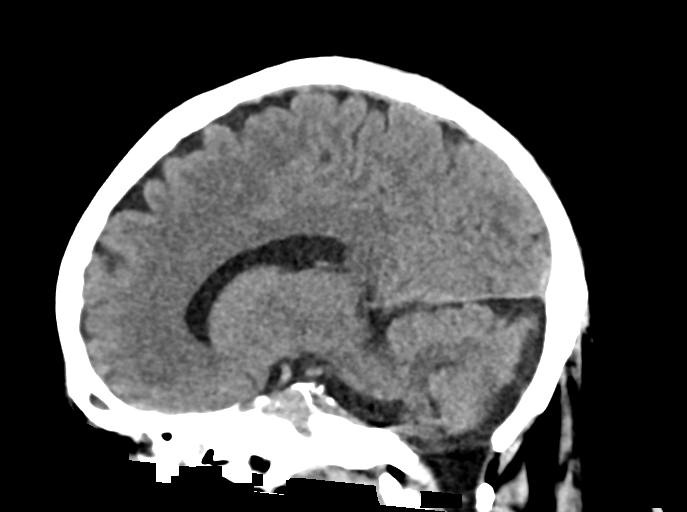
[im 40/61  brain]
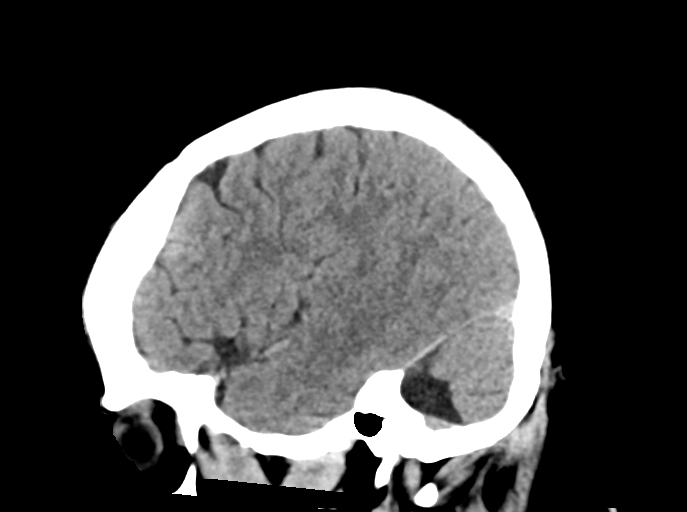

[16 of 47 positions shown; findings below may reference images not displayed]

FINDINGS: Brain: Mild prominence of the CSF spaces in the posterior cranial
fossa are seen. No findings to suggest acute hemorrhage, acute
infarction or space-occupying mass lesion is seen.

Vascular: No hyperdense vessel or unexpected calcification.

Skull: Normal. Negative for fracture or focal lesion.

Sinuses/Orbits: Some mucosal thickening is noted within the ethmoid
and sphenoid sinuses likely of a chronic nature. Postsurgical
changes in the maxillary antra are noted.

Other: Air is noted in subcutaneous venous branches likely related
to IV initiation
IMPRESSION: Chronic appearing changes without acute intracranial abnormality.
